# Patient Record
Sex: Female | Born: 1980 | Race: White | Hispanic: No | Marital: Married | Smoking: Never smoker
Health system: Southern US, Community
[De-identification: ages and names within clinical notes are randomized; demographics above are authoritative.]

## PROBLEM LIST (undated history)

## (undated) DIAGNOSIS — I10 Essential (primary) hypertension: Secondary | ICD-10-CM

## (undated) DIAGNOSIS — G35 Multiple sclerosis: Secondary | ICD-10-CM

## (undated) DIAGNOSIS — Z87442 Personal history of urinary calculi: Secondary | ICD-10-CM

## (undated) DIAGNOSIS — Z6791 Unspecified blood type, Rh negative: Secondary | ICD-10-CM

## (undated) DIAGNOSIS — O26899 Other specified pregnancy related conditions, unspecified trimester: Secondary | ICD-10-CM

## (undated) DIAGNOSIS — O364XX0 Maternal care for intrauterine death, not applicable or unspecified: Secondary | ICD-10-CM

## (undated) DIAGNOSIS — F419 Anxiety disorder, unspecified: Secondary | ICD-10-CM

## (undated) DIAGNOSIS — D649 Anemia, unspecified: Secondary | ICD-10-CM

## (undated) HISTORY — DX: Essential (primary) hypertension: I10

## (undated) HISTORY — DX: Maternal care for intrauterine death, not applicable or unspecified: O36.4XX0

## (undated) HISTORY — PX: INTRAUTERINE DEVICE (IUD) INSERTION: SHX5877

## (undated) HISTORY — DX: Unspecified blood type, rh negative: Z67.91

## (undated) HISTORY — DX: Multiple sclerosis: G35

## (undated) HISTORY — DX: Other specified pregnancy related conditions, unspecified trimester: O26.899

---

## 2002-02-25 HISTORY — PX: BREAST REDUCTION SURGERY: SHX8

## 2005-09-27 DIAGNOSIS — O364XX0 Maternal care for intrauterine death, not applicable or unspecified: Secondary | ICD-10-CM

## 2005-09-27 HISTORY — DX: Maternal care for intrauterine death, not applicable or unspecified: O36.4XX0

## 2005-10-01 ENCOUNTER — Inpatient Hospital Stay (HOSPITAL_COMMUNITY): Admission: AD | Admit: 2005-10-01 | Discharge: 2005-10-02 | Payer: Self-pay | Admitting: Gynecology

## 2005-10-01 ENCOUNTER — Ambulatory Visit (HOSPITAL_COMMUNITY): Admission: RE | Admit: 2005-10-01 | Discharge: 2005-10-02 | Payer: Self-pay | Admitting: Gynecology

## 2005-10-02 ENCOUNTER — Encounter (INDEPENDENT_AMBULATORY_CARE_PROVIDER_SITE_OTHER): Payer: Self-pay | Admitting: Specialist

## 2006-02-03 ENCOUNTER — Ambulatory Visit: Payer: Self-pay | Admitting: Gynecology

## 2006-02-06 ENCOUNTER — Inpatient Hospital Stay (HOSPITAL_COMMUNITY): Admission: AD | Admit: 2006-02-06 | Discharge: 2006-02-06 | Payer: Self-pay | Admitting: Gynecology

## 2006-02-17 ENCOUNTER — Ambulatory Visit: Payer: Self-pay | Admitting: Gynecology

## 2006-03-17 ENCOUNTER — Ambulatory Visit: Payer: Self-pay | Admitting: Gynecology

## 2006-04-10 ENCOUNTER — Ambulatory Visit: Payer: Self-pay | Admitting: Obstetrics & Gynecology

## 2006-05-06 ENCOUNTER — Ambulatory Visit (HOSPITAL_COMMUNITY): Admission: RE | Admit: 2006-05-06 | Discharge: 2006-05-06 | Payer: Self-pay | Admitting: Gynecology

## 2006-05-08 ENCOUNTER — Ambulatory Visit: Payer: Self-pay | Admitting: Obstetrics & Gynecology

## 2006-06-05 ENCOUNTER — Ambulatory Visit: Payer: Self-pay | Admitting: Obstetrics & Gynecology

## 2006-07-07 ENCOUNTER — Ambulatory Visit: Payer: Self-pay | Admitting: Gynecology

## 2006-07-14 ENCOUNTER — Ambulatory Visit: Payer: Self-pay | Admitting: Gynecology

## 2006-07-21 ENCOUNTER — Ambulatory Visit: Payer: Self-pay | Admitting: Gynecology

## 2006-08-04 ENCOUNTER — Ambulatory Visit: Payer: Self-pay | Admitting: Gynecology

## 2006-08-18 ENCOUNTER — Ambulatory Visit: Payer: Self-pay | Admitting: Gynecology

## 2006-08-25 ENCOUNTER — Ambulatory Visit: Payer: Self-pay | Admitting: Gynecology

## 2006-09-01 ENCOUNTER — Ambulatory Visit: Payer: Self-pay | Admitting: Gynecology

## 2006-09-08 ENCOUNTER — Ambulatory Visit: Payer: Self-pay | Admitting: Gynecology

## 2006-09-16 ENCOUNTER — Ambulatory Visit: Payer: Self-pay | Admitting: Family Medicine

## 2006-09-23 ENCOUNTER — Ambulatory Visit: Payer: Self-pay | Admitting: Family Medicine

## 2006-09-25 ENCOUNTER — Inpatient Hospital Stay (HOSPITAL_COMMUNITY): Admission: AD | Admit: 2006-09-25 | Discharge: 2006-09-25 | Payer: Self-pay | Admitting: Obstetrics and Gynecology

## 2006-09-25 ENCOUNTER — Ambulatory Visit: Payer: Self-pay | Admitting: Obstetrics and Gynecology

## 2006-09-30 ENCOUNTER — Ambulatory Visit: Payer: Self-pay | Admitting: Family Medicine

## 2006-10-05 ENCOUNTER — Ambulatory Visit: Payer: Self-pay | Admitting: Family Medicine

## 2006-10-05 ENCOUNTER — Inpatient Hospital Stay (HOSPITAL_COMMUNITY): Admission: AD | Admit: 2006-10-05 | Discharge: 2006-10-07 | Payer: Self-pay | Admitting: Family Medicine

## 2006-11-04 ENCOUNTER — Ambulatory Visit: Payer: Self-pay | Admitting: Family Medicine

## 2006-11-18 ENCOUNTER — Ambulatory Visit: Payer: Self-pay | Admitting: Family Medicine

## 2006-12-09 ENCOUNTER — Ambulatory Visit: Payer: Self-pay | Admitting: Family Medicine

## 2006-12-09 ENCOUNTER — Encounter: Payer: Self-pay | Admitting: Family Medicine

## 2008-05-16 ENCOUNTER — Encounter: Payer: Self-pay | Admitting: Obstetrics & Gynecology

## 2008-05-16 ENCOUNTER — Ambulatory Visit: Payer: Self-pay | Admitting: Obstetrics & Gynecology

## 2010-10-23 ENCOUNTER — Ambulatory Visit: Payer: Self-pay | Admitting: Obstetrics & Gynecology

## 2011-03-12 NOTE — Assessment & Plan Note (Signed)
NAMESHERRYL, VALIDO NO.:  [id number]   MEDICAL RECORD NO.:  [id number]          PATIENT TYPE:  POB   LOCATION:  [facility] at [facility]         FACILITY:  [facility]   PHYSICIAN:  Allie Bossier, MD        DATE OF BIRTH:  Jan 03, 1981   DATE OF SERVICE:  05/16/2008                                  CLINIC NOTE   Aivy is a 30 year old married white gravida 3, para 1-0-2-1, who has  a 39-month-old son.  She comes in for annual exam.  She has no  complaints.  She is happy with her Mirena.  She has worked on weight  loss since last year and has lost 30 pounds.  She has done this by not  eating as much.Marland Kitchen   PAST MEDICAL HISTORY:  Migraine (greatly reduced in frequency).   PAST SURGICAL HISTORY:  Bilateral breast reduction.   MEDICATIONS:  Mirena, multivitamin daily, and ibuprofen approximately 3  times a week.   No known drug allergies.   FAMILY HISTORY:  Significant for her maternal grandmother having breast  cancer, colon cancer, and lung cancer, she is deceased.  No latex  allergies.  The patient also had a 19-week demise that fetus had caught  her syndrome.   SOCIAL HISTORY:  Negative for tobacco, alcohol, or drug use.   PHYSICAL EXAMINATION:  VITAL SIGNS:  Weight 164 pounds, blood pressure  121/85, height is 5 feet 6 inches, and pulse is 89.  HEENT:  Normal.  BREAST:  Normal.  HEART:  Regular rate and rhythm.  LUNGS:  Clear to auscultation bilaterally.  ABDOMEN:  Benign.  PELVIS:  External genitalia, shaved no lesions.  Cervix parous, no  lesions.  Uterus, normal size and shape, slightly anteverted.  Adnexa  nontender, no masses.   ASSESSMENT/PLAN:  Annual exam.  Recommend self-breast and self-vulvar  exams monthly and she will continue her Mirena.      Allie Bossier, MD     MCD/MEDQ  D:  05/16/2008  T:  05/17/2008  Job:  [id number]

## 2011-03-15 NOTE — Assessment & Plan Note (Signed)
Robin Diaz, Robin Diaz             ACCOUNT NO.:  [id number]   MEDICAL RECORD NO.:  [id number]          PATIENT TYPE:  POB   LOCATION:  [facility]         FACILITY:  [facility]   PHYSICIAN:  Tinnie Gens, MD        DATE OF BIRTH:  1981-04-07   DATE OF SERVICE:  11/04/2006                                  CLINIC NOTE   CHIEF COMPLAINT:  IUD Insertion.   HISTORY OF PRESENT ILLNESS:  The patient is a 31 year old, gravida 3,  para 1-0-2-1, who is 4 weeks status post vaginal delivery of a female  infant, 9 pounds 4 ounces.  The patient is not currently breast feeding,  is not on birth control, but has not been sexually active.  The patient  reports no significant depression.  The baby is doing well and sleeps  through the night.  The patient desires IUD for contraception.  She  stopped bleeding approximately 2 weeks ago.   PHYSICAL EXAMINATION:  VITAL SIGNS: As noted on the chart.  GENERAL:  Well-developed, well-nourished female in no acute distress.  GU:  Female genitalia.  Vagina pink and rugae.  Uterus approximately 8-  week size, anteverted.  No adnexal mass or tenderness.   PROCEDURE:  Cervix was visualized, grasped anteriorly with single-tip  tenaculum.  The uterus was dilated to approximately 10 cm.  Jearld Adjutant IUD  was placed without difficulty.  Strings are trimmed to 1.5 cm.  The  patient tolerated the procedure well.   IMPRESSION:  1. Postpartum check, doing well.  2. Intrauterine device insertion.   PLAN:  1. Follow up in 2 weeks for string check.  2. Pap smear October 2008.           ______________________________  Tinnie Gens, MD     TP/MEDQ  D:  11/04/2006  T:  11/04/2006  Job:  [id number]

## 2011-03-15 NOTE — Assessment & Plan Note (Signed)
Robin Diaz, Robin Diaz             ACCOUNT NO.:  [id number]   MEDICAL RECORD NO.:  [id number]          PATIENT TYPE:  POB   LOCATION:  [facility]         FACILITY:  [facility]   PHYSICIAN:  Tinnie Gens, MD        DATE OF BIRTH:  02-02-1981   DATE OF SERVICE:  12/09/2006                                  CLINIC NOTE   CHIEF COMPLAINT:  Yearly examination.   HISTORY OF PRESENT ILLNESS:  The patient is a 30 year old gravida 3,  para 1-0-2-1, who is 8 weeks status post vaginal delivery of a female  infant, who comes in for an annual exam.  It has been since October,  2006 since her last Pap smear.  She has no other significant complaints  today.  She has an IUD which seems to be working fine for her except for  that she would like to lose some weight.   PAST MEDICAL HISTORY:  Significant for migraine headaches.   PAST SURGICAL HISTORY:  Bilateral breast reduction.   MEDICATIONS:  None.   ALLERGIES:  NONE KNOWN.   FAMILY HISTORY:  Her first pregnancy was a miscarriage at approximately  6 weeks.  Second pregnancy in December, 2006, she had a 19 week fetal  demise with Potter's syndrome.  Last pregnancy was a term vaginal  delivery of a 9 pound 4 ounce female infant.   GYN HISTORY:  No history of STD or abnormal Pap.   FOURTEEN POINT REVIEW OF SYSTEMS:  Reviewed.  Is negative for headache,  chest pain, shortness of breath, dysuria, constipation, abdominal pain,  nausea, vomiting, diarrhea, lower extremity swelling.   FAMILY HISTORY:  Significant for obesity.   SOCIAL HISTORY:  No tobacco, alcohol or drug use.  She works in [employer].   EXAM:  Her vitals are as noted in the chart.  Weight is 188 pounds.  She  is a well-developed, well-nourished female in no acute distress.  GU:  Normal external female genitalia.  BUS:  Normal.  VAGINA:  Pink and rugated.  CERVIX:  Parous, without lesion.  UTERUS:  Small, anteverted, no adnexal masses or tenderness.  IUD  strings are visible at  the cervical os.   IMPRESSION:  1. Yearly exam.  2. Obesity.   PLAN:  1. Pap smear today.  2. Discussed lifestyle changes, exercise, weight lifting and diet as a      means to control weight in the long term.  The patient will follow      up as needed for weight checks and encouragement, otherwise she      will be back in a year for yearly exam.           ______________________________  Tinnie Gens, MD     TP/MEDQ  D:  12/09/2006  T:  12/09/2006  Job:  [id number]

## 2011-03-15 NOTE — Discharge Summary (Signed)
Robin Diaz, Robin Diaz              ACCOUNT NO.:  [id number]   MEDICAL RECORD NO.:  [id number]          PATIENT TYPE:  INP   LOCATION:  9175                          FACILITY:  [facility]   PHYSICIAN:  Ginger Carne, MD  DATE OF BIRTH:  1981-05-24   DATE OF ADMISSION:  10/01/2005  DATE OF DISCHARGE:  10/02/2005                                 DISCHARGE SUMMARY   REASON FOR HOSPITALIZATION:  Nineteen weeks gestation.  Fetal death in  utero.   IN-HOSPITAL PROCEDURES:  Cytotec induction.   FINAL DIAGNOSIS:  1.  Nineteen weeks gestation.  2.  Fetal death in utero.  3.  Nonviable delivery of a female fetus weighing 200 grams.   HOSPITAL COURSE:  This is a 30 year old gravida 2, para 0 Caucasian female,  [redacted] weeks gestation by dates and ultrasound, who presented to the obstetrical  ultrasound unit at [facility] on October 01, 2005 for routine  anatomical scan.  At that time, a fetal death in utero was noted.  A  positive fetal heart rate was obtained in the office 1 day prior. The scan  demonstrated low fluid with a suggestion of Potter's syndrome. She is B  negative.   The patient underwent a Cytotec induction and delivered in the early morning  of October 02, 2005.  Placenta spontaneously delivered. It was complete and  intact. The patient's flow moderated during the course of the morning, and  she was sent home in the early afternoon of said date.   DISCHARGE INSTRUCTIONS:  She was discharged with routine instructions  including contacting the office for temperature elevation above 100.4  degrees Fahrenheit, perineal pain or increased bleeding.   DISCHARGE MEDICATIONS:  1.  She was prescribed Vicodin 1-2 every 4 to 6 hours as needed for pain, in      addition to utilizing ibuprofen.  2.  She received a RhoGAM shot prior to discharge.  3.  Xanax 0.5 milligrams also prescribed p.r.n. anxiety.   FOLLOW UP:  She will be rechecked in 4 weeks.      Ginger Carne, MD  Electronically Signed     SHB/MEDQ  D:  10/07/2005  T:  10/07/2005  Job:  [id number]

## 2011-03-18 ENCOUNTER — Ambulatory Visit (INDEPENDENT_AMBULATORY_CARE_PROVIDER_SITE_OTHER): Payer: PRIVATE HEALTH INSURANCE | Admitting: Obstetrics and Gynecology

## 2011-03-18 DIAGNOSIS — Z01419 Encounter for gynecological examination (general) (routine) without abnormal findings: Secondary | ICD-10-CM

## 2011-03-18 DIAGNOSIS — Z1272 Encounter for screening for malignant neoplasm of vagina: Secondary | ICD-10-CM

## 2011-03-18 DIAGNOSIS — Z30432 Encounter for removal of intrauterine contraceptive device: Secondary | ICD-10-CM

## 2011-04-15 ENCOUNTER — Ambulatory Visit: Payer: PRIVATE HEALTH INSURANCE | Admitting: Obstetrics and Gynecology

## 2011-05-09 NOTE — Assessment & Plan Note (Signed)
Robin Diaz, OBI NO.:  [id number]  MEDICAL RECORD NO.:  [id number]           PATIENT TYPE:  LOCATION:  [facility] at [facility]           FACILITY:  PHYSICIAN:  Argentina Donovan, MD        DATE OF BIRTH:  January 17, 1981  DATE OF SERVICE:  03/18/2011                                 CLINIC NOTE  HISTORY OF PRESENT ILLNESS:  The patient is a 30 year old gravida 3, para 1-0-2-1 whose last child is almost 13 years old is in for removal an IUD in order to conceive.  She has gained about 35 pounds in the last year since she has had an IUD.  The thyroid is symmetrical.  No dominant masses.  Breasts are symmetrical.  No nipple discharge.  No supraclavicular, axillary nodes.  No dominant masses.  There are surgical scars and was slightly firm at the area of the scars from previous reduction mammoplasty.  PHYSICAL EXAMINATION:  ABDOMEN:  Soft, flat, nontender.  No masses or organomegaly. SKIN:  She does have a subcutaneous firm nodule attached to the skin probably sebaceous cyst just above the suprapubic area. GENITALIA:  External is normal.  BUS within normal limits.  The vagina is clean and well rugated.  The cervix is clean, parous.  Pap smear was taken.  IUD was removed without incident.  The uterus anterior normal size, shape, consistency.  Adnexa could not be outlined.  IMPRESSION:  Normal gynecological examination, removal of IUD.  Patient encouraged to start oral prenatal vitamins with folic acid. Prescription given for the same.          ______________________________ Argentina Donovan, MD    PR/MEDQ  D:  03/18/2011  T:  03/19/2011  Job:  [id number]

## 2011-08-01 ENCOUNTER — Ambulatory Visit: Payer: Self-pay

## 2011-12-10 ENCOUNTER — Observation Stay: Payer: Self-pay

## 2011-12-18 ENCOUNTER — Observation Stay: Payer: Self-pay

## 2011-12-18 LAB — PIH PROFILE
BUN: 6 mg/dL — ABNORMAL LOW (ref 7–18)
Chloride: 107 mmol/L (ref 98–107)
Co2: 24 mmol/L (ref 21–32)
Creatinine: 0.63 mg/dL (ref 0.60–1.30)
HGB: 9.8 g/dL — ABNORMAL LOW (ref 12.0–16.0)
MCH: 28.1 pg (ref 26.0–34.0)
MCHC: 33.7 g/dL (ref 32.0–36.0)
MCV: 83 fL (ref 80–100)
Potassium: 3.4 mmol/L — ABNORMAL LOW (ref 3.5–5.1)
RBC: 3.47 10*6/uL — ABNORMAL LOW (ref 3.80–5.20)
Sodium: 142 mmol/L (ref 136–145)

## 2011-12-18 LAB — PROTEIN / CREATININE RATIO, URINE: Protein/Creat. Ratio: 146 mg/gCREAT (ref 0–200)

## 2011-12-31 ENCOUNTER — Inpatient Hospital Stay: Payer: Self-pay

## 2011-12-31 LAB — PIH PROFILE
Co2: 19 mmol/L — ABNORMAL LOW (ref 21–32)
EGFR (African American): >60
EGFR (Non-African Amer.): >60
Glucose: 116 mg/dL — ABNORMAL HIGH (ref 65–99)
HGB: 10.1 g/dL — ABNORMAL LOW (ref 12.0–16.0)
MCHC: 33.7 g/dL (ref 32.0–36.0)
RDW: 14.6 % — ABNORMAL HIGH (ref 11.5–14.5)
SGOT(AST): 7 U/L — ABNORMAL LOW (ref 15–37)
Uric Acid: 3.4 mg/dL (ref 2.6–6.0)
WBC: 13.6 10*3/uL — ABNORMAL HIGH (ref 3.6–11.0)

## 2012-01-01 LAB — PROTEIN / CREATININE RATIO, URINE
Creatinine, Urine: 94.5 mg/dL (ref 30.0–125.0)
Protein/Creat. Ratio: 116 mg/gCREAT (ref 0–200)

## 2012-01-02 LAB — HEMATOCRIT: HCT: 27.3 % — ABNORMAL LOW (ref 35.0–47.0)

## 2012-01-06 LAB — PATHOLOGY REPORT

## 2012-05-29 ENCOUNTER — Encounter: Payer: Self-pay | Admitting: Obstetrics and Gynecology

## 2012-10-05 ENCOUNTER — Emergency Department: Payer: Self-pay | Admitting: Emergency Medicine

## 2012-10-05 LAB — BASIC METABOLIC PANEL
Anion Gap: 6 — ABNORMAL LOW (ref 7–16)
BUN: 10 mg/dL (ref 7–18)
Creatinine: 0.66 mg/dL (ref 0.60–1.30)
EGFR (African American): >60
EGFR (Non-African Amer.): >60
Glucose: 99 mg/dL (ref 65–99)
Osmolality: 278 (ref 275–301)

## 2012-10-05 LAB — URINALYSIS, COMPLETE
Bacteria: NONE SEEN
Leukocyte Esterase: NEGATIVE
Nitrite: NEGATIVE
RBC,UR: 8751 /HPF (ref 0–5)
Specific Gravity: 1.014 (ref 1.003–1.030)
WBC UR: 12 /HPF (ref 0–5)

## 2012-10-05 LAB — CBC WITH DIFFERENTIAL/PLATELET
Basophil %: 0.6 %
Eosinophil %: 2.7 %
HCT: 36.3 % (ref 35.0–47.0)
HGB: 12 g/dL (ref 12.0–16.0)
Lymphocyte %: 30.6 %
MCH: 26.7 pg (ref 26.0–34.0)
MCHC: 33.2 g/dL (ref 32.0–36.0)
Neutrophil #: 5.7 10*3/uL (ref 1.4–6.5)
RBC: 4.5 10*6/uL (ref 3.80–5.20)

## 2012-10-05 LAB — PREGNANCY, URINE: Pregnancy Test, Urine: NEGATIVE m[IU]/mL

## 2013-03-31 ENCOUNTER — Emergency Department: Payer: Self-pay | Admitting: Emergency Medicine

## 2013-03-31 LAB — URINALYSIS, COMPLETE
Bacteria: NONE SEEN
Glucose,UR: NEGATIVE mg/dL (ref 0–75)
Nitrite: NEGATIVE
Ph: 6 (ref 4.5–8.0)
Squamous Epithelial: 3

## 2013-03-31 LAB — COMPREHENSIVE METABOLIC PANEL
BUN: 12 mg/dL (ref 7–18)
Calcium, Total: 9.4 mg/dL (ref 8.5–10.1)
Co2: 27 mmol/L (ref 21–32)
EGFR (African American): >60
Glucose: 107 mg/dL — ABNORMAL HIGH (ref 65–99)
Osmolality: 276 (ref 275–301)
SGOT(AST): 17 U/L (ref 15–37)
SGPT (ALT): 31 U/L (ref 12–78)

## 2013-03-31 LAB — CBC
HCT: 38 % (ref 35.0–47.0)
HGB: 12.8 g/dL (ref 12.0–16.0)
MCH: 27.5 pg (ref 26.0–34.0)
Platelet: 297 10*3/uL (ref 150–440)

## 2013-10-28 DIAGNOSIS — G35 Multiple sclerosis: Secondary | ICD-10-CM

## 2013-10-28 HISTORY — DX: Multiple sclerosis: G35

## 2013-11-23 ENCOUNTER — Emergency Department: Payer: Self-pay | Admitting: Emergency Medicine

## 2013-11-23 LAB — COMPREHENSIVE METABOLIC PANEL
ALK PHOS: 67 U/L
ANION GAP: 4 — AB (ref 7–16)
AST: 23 U/L (ref 15–37)
Albumin: 3.9 g/dL (ref 3.4–5.0)
BILIRUBIN TOTAL: 0.3 mg/dL (ref 0.2–1.0)
BUN: 8 mg/dL (ref 7–18)
CHLORIDE: 106 mmol/L (ref 98–107)
CREATININE: 0.67 mg/dL (ref 0.60–1.30)
Calcium, Total: 9.4 mg/dL (ref 8.5–10.1)
Co2: 27 mmol/L (ref 21–32)
EGFR (Non-African Amer.): >60
GLUCOSE: 100 mg/dL — AB (ref 65–99)
OSMOLALITY: 272 (ref 275–301)
Potassium: 3.9 mmol/L (ref 3.5–5.1)
SGPT (ALT): 26 U/L (ref 12–78)
Sodium: 137 mmol/L (ref 136–145)
TOTAL PROTEIN: 7.7 g/dL (ref 6.4–8.2)

## 2013-11-23 LAB — CBC
HCT: 40.5 % (ref 35.0–47.0)
HGB: 13.5 g/dL (ref 12.0–16.0)
MCH: 28 pg (ref 26.0–34.0)
MCHC: 33.3 g/dL (ref 32.0–36.0)
MCV: 84 fL (ref 80–100)
Platelet: 279 10*3/uL (ref 150–440)
RBC: 4.81 10*6/uL (ref 3.80–5.20)
RDW: 13.1 % (ref 11.5–14.5)
WBC: 9.6 10*3/uL (ref 3.6–11.0)

## 2013-11-23 LAB — URINALYSIS, COMPLETE
Glucose,UR: NEGATIVE mg/dL (ref 0–75)
Ketone: NEGATIVE
LEUKOCYTE ESTERASE: NEGATIVE
NITRITE: NEGATIVE
PROTEIN: NEGATIVE
Ph: 7 (ref 4.5–8.0)
RBC,UR: 1 /HPF (ref 0–5)
Specific Gravity: 1.006 (ref 1.003–1.030)
Squamous Epithelial: 5
WBC UR: 2 /HPF (ref 0–5)

## 2013-11-26 ENCOUNTER — Ambulatory Visit: Payer: Self-pay | Admitting: Neurology

## 2013-11-26 LAB — HCG, QUANTITATIVE, PREGNANCY: Beta Hcg, Quant.: <1 m[IU]/mL — ABNORMAL LOW

## 2013-12-01 ENCOUNTER — Ambulatory Visit: Payer: Self-pay | Admitting: Neurology

## 2013-12-01 LAB — CSF CELL COUNT WITH DIFFERENTIAL
CSF Tube #: 1
CSF Tube #: 4
EOS PCT: 0 %
EOS PCT: 0 %
LYMPHS PCT: 0 %
Lymphocytes: 0 %
MONOCYTES/MACROPHAGES: 0 %
MONOCYTES/MACROPHAGES: 0 %
NEUTROS PCT: 0 %
NEUTROS PCT: 0 %
OTHER CELLS: 0 %
Other Cells: 0 %
RBC (CSF): 106 /mm3
RBC (CSF): 62 /mm3
WBC (CSF): 0 /mm3
WBC (CSF): 0 /mm3

## 2013-12-01 LAB — PROTEIN, CSF: Protein, CSF: 39 mg/dL (ref 15–45)

## 2013-12-01 LAB — GLUCOSE, CSF: GLUCOSE, CSF: 96 mg/dL — AB (ref 40–75)

## 2013-12-02 ENCOUNTER — Ambulatory Visit: Payer: Self-pay | Admitting: Neurology

## 2013-12-04 LAB — CSF CULTURE

## 2014-05-26 ENCOUNTER — Encounter: Payer: Self-pay | Admitting: Neurology

## 2014-05-28 ENCOUNTER — Encounter: Payer: Self-pay | Admitting: Neurology

## 2014-06-28 ENCOUNTER — Encounter: Payer: Self-pay | Admitting: Neurology

## 2014-07-28 ENCOUNTER — Encounter: Payer: Self-pay | Admitting: Neurology

## 2014-08-05 IMAGING — CT CT HEAD WITHOUT CONTRAST
1 series · 16 of 30 positions shown, 20 images · non-contrast
Comparison: None.

CLINICAL DATA: Blurred vision, headache

EXAM:
CT HEAD WITHOUT CONTRAST
TECHNIQUE: Contiguous axial images were obtained from the base of the skull
through the vertex without intravenous contrast.

[Series 2: head wo · axial · 0.40mm/px · z∈[-153,-27]mm · 16 of 32 slices shown, 20 images]
[im 2/32  brain]
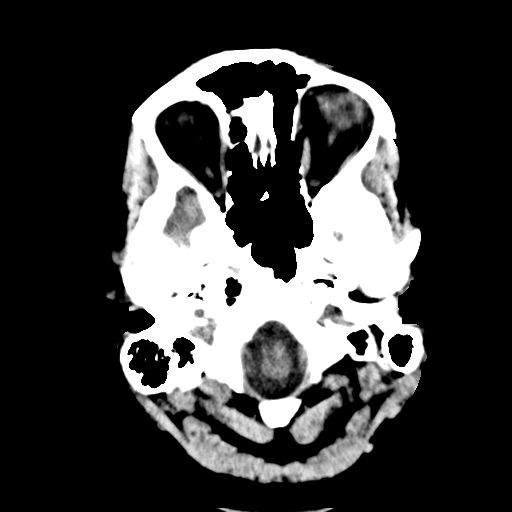
[im 2/32  bone]
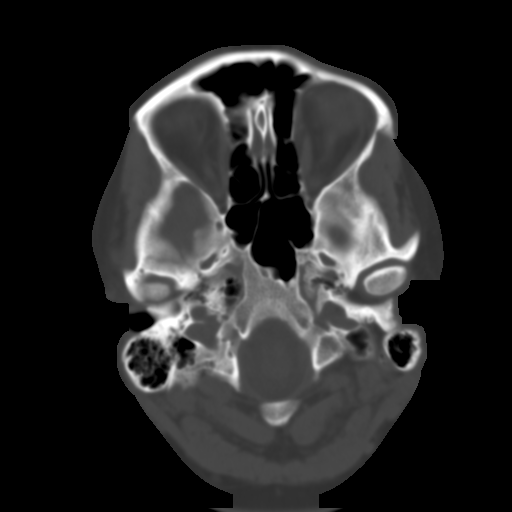
[im 4/32  brain]
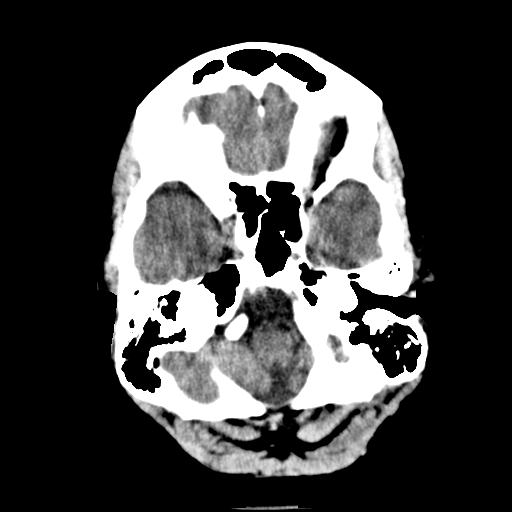
[im 6/32  brain]
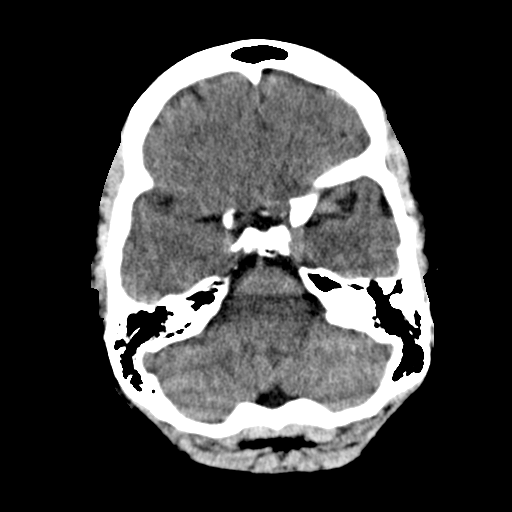
[im 8/32  brain]
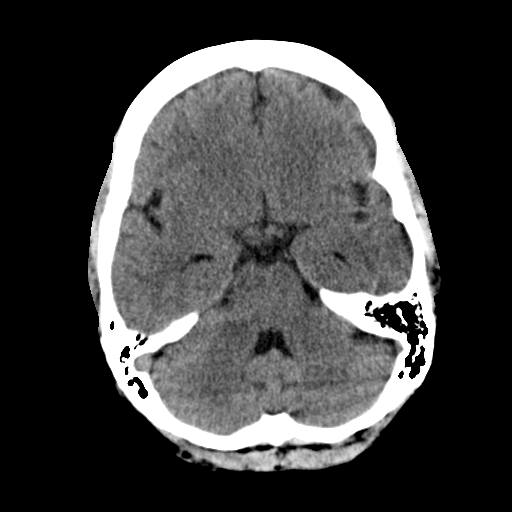
[im 9/32  brain]
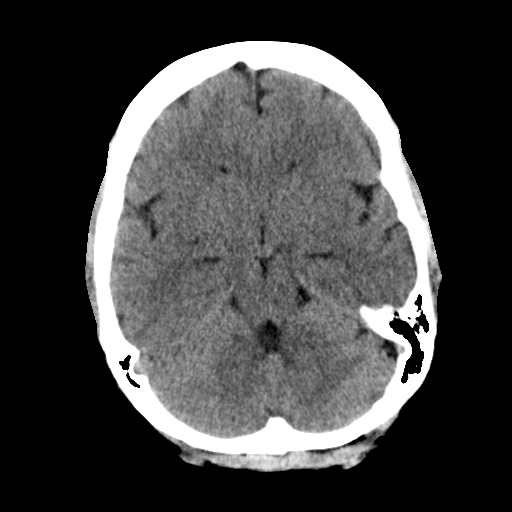
[im 9/32  bone]
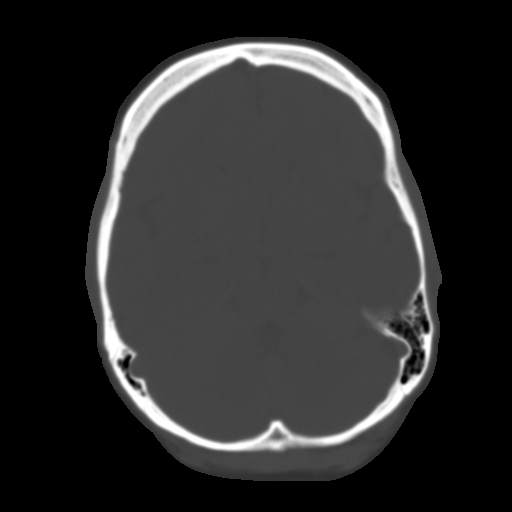
[im 11/32  brain]
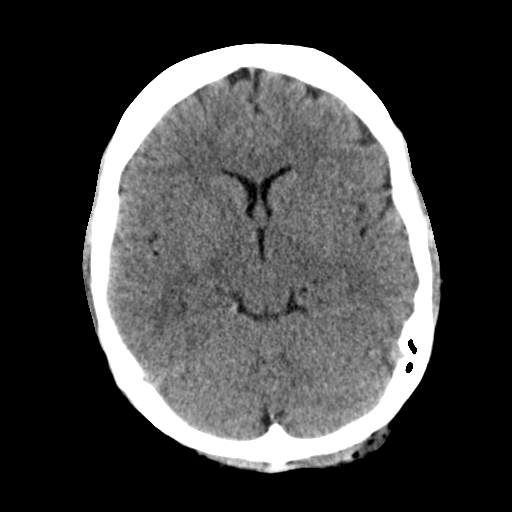
[im 13/32  brain]
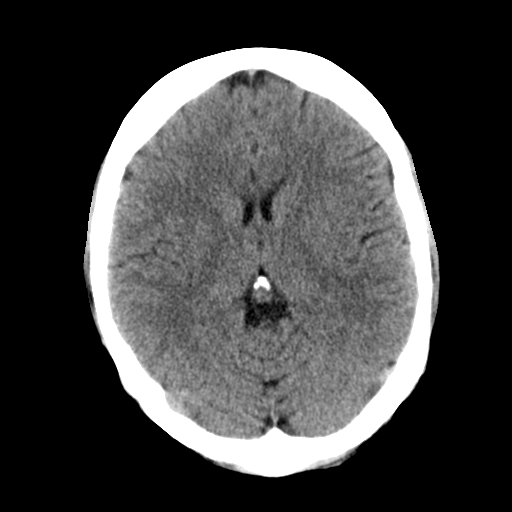
[im 15/32  brain]
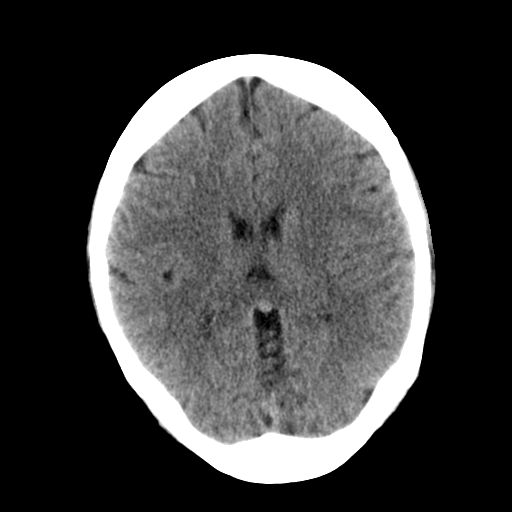
[im 17/32  brain]
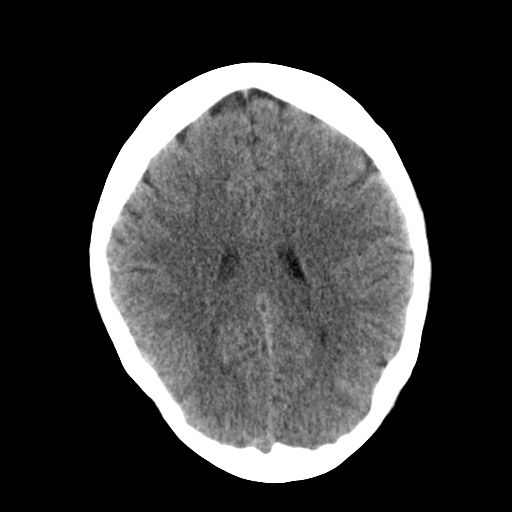
[im 17/32  bone]
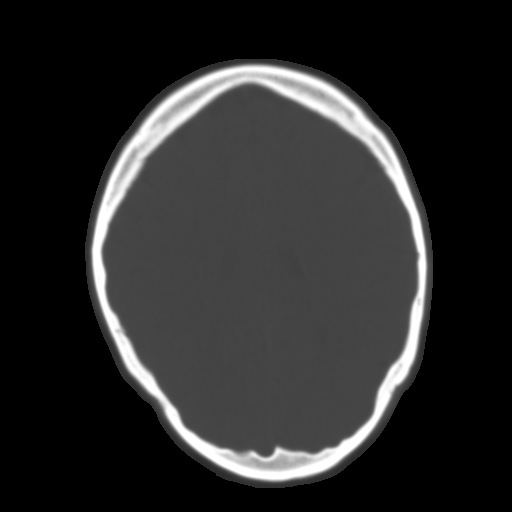
[im 19/32  brain]
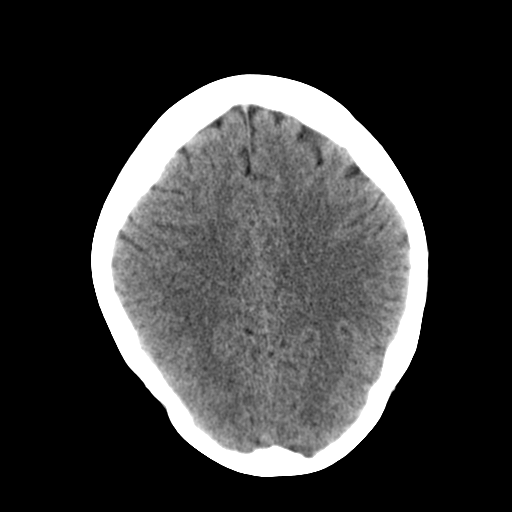
[im 21/32  brain]
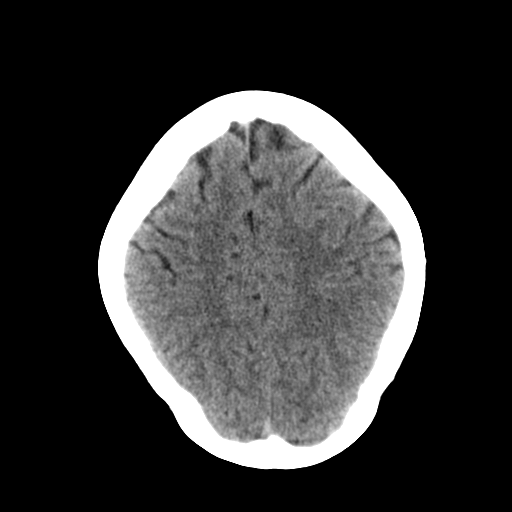
[im 23/32  brain]
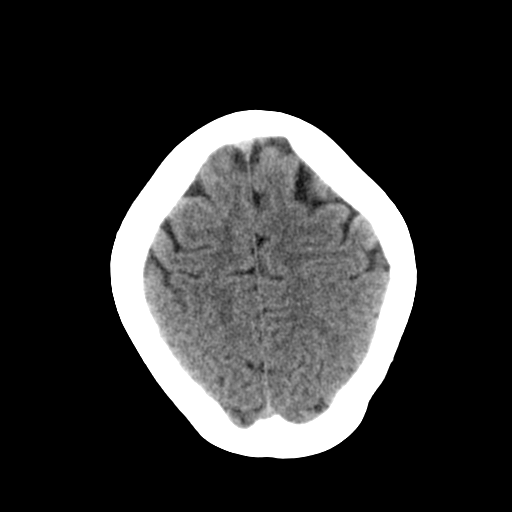
[im 24/32  brain]
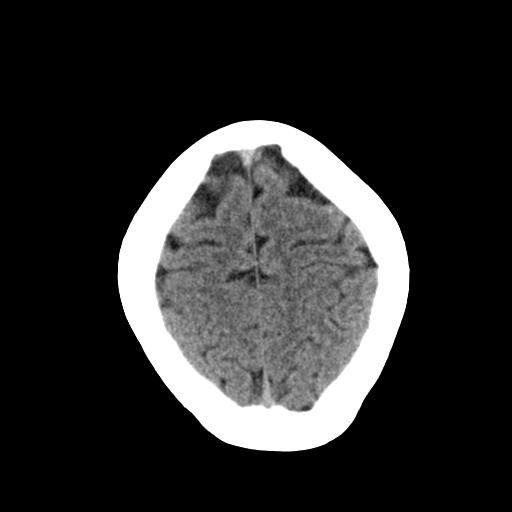
[im 24/32  bone]
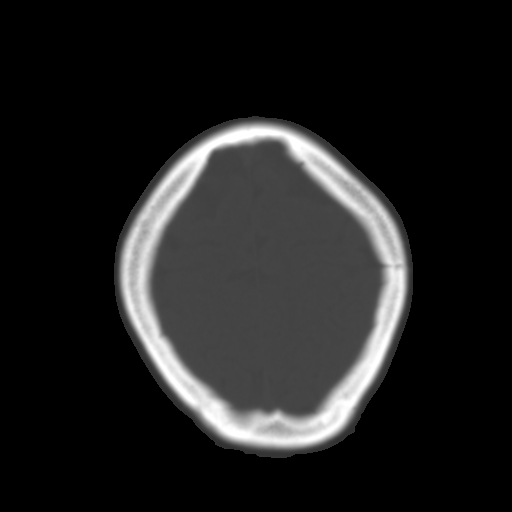
[im 26/32  brain]
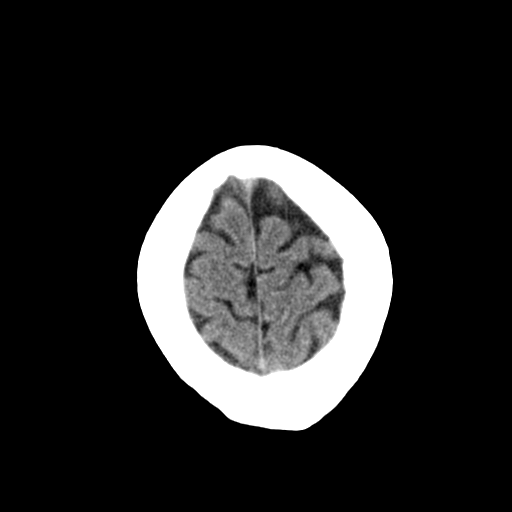
[im 28/32  brain]
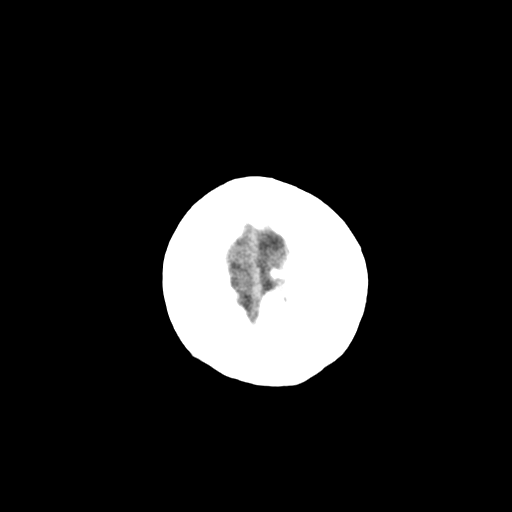
[im 30/32  brain]
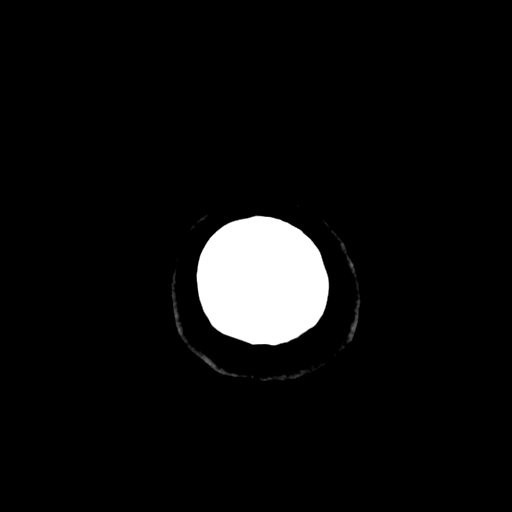

[16 of 30 positions shown; findings below may reference images not displayed]

FINDINGS: Mild atrophy. There is no evidence of acute intracranial hemorrhage,
brain edema, mass lesion, acute infarction, mass effect, or midline
shift. Acute infarct may be inapparent on noncontrast CT. No other
intra-axial abnormalities are seen, and the ventricles and sulci are
within normal limits in size and symmetry. No abnormal extra-axial
fluid collections or masses are identified. No significant calvarial
abnormality.
IMPRESSION: Negative for bleed or other acute intracranial process.

## 2014-08-13 IMAGING — RF LUMBAR PUNCTURE FLUORO GUIDE
1 series · 2 of 2 positions shown · non-contrast
Comparison: none

CLINICAL DATA: Unexplained visual loss and headache

[Series 1: fluoro_iodine 2fps_bw · 0.18mm/px · 2 of 2 frames shown]
[frame 1/2]
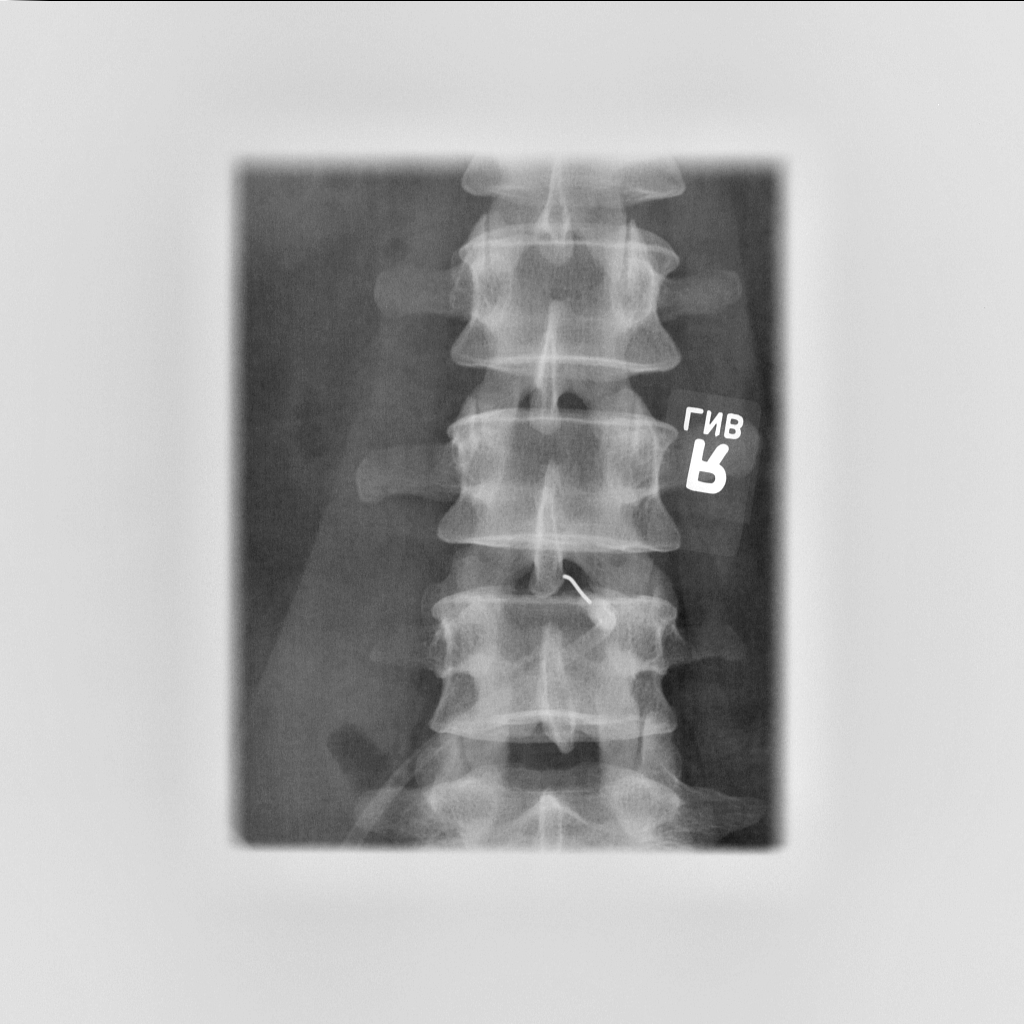
[frame 2/2]
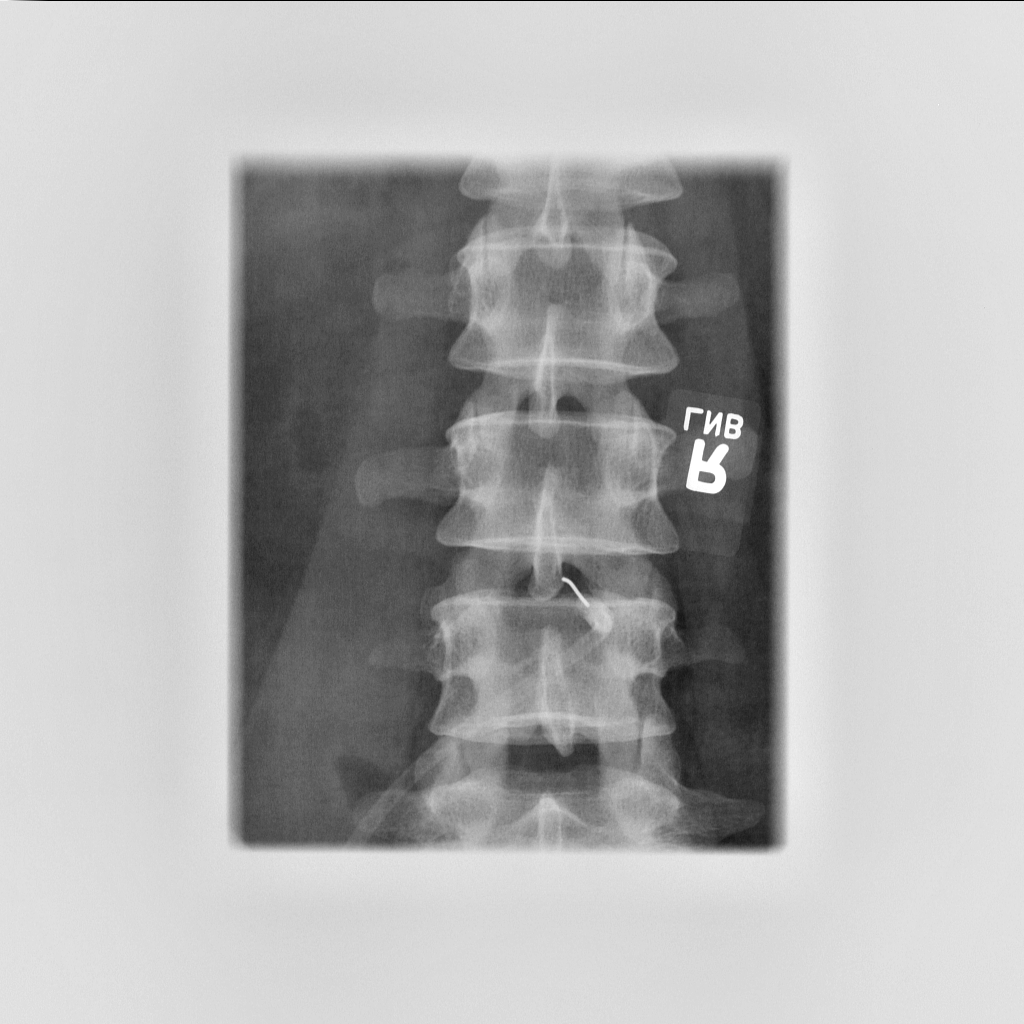

[2 of 2 positions shown; findings below may reference images not displayed]

EXAM:
DIAGNOSTIC LUMBAR PUNCTURE UNDER FLUOROSCOPIC GUIDANCE

FLUOROSCOPY TIME:  0 min, 36 seconds

PROCEDURE:
Informed consent was obtained from the patient prior to the
procedure, including potential complications of headache, allergy,
and pain. With the patient prone, the lower back was prepped with
Betadine. 1% Lidocaine was used for local anesthesia. Lumbar
puncture was performed at the L3-4 level using a 22 gauge needle
with return of clear CSF. An opening pressure was not obtained due
to the slow release of the CSF which required aspiration. FourMl of
CSF were obtained for laboratory studies. The patient tolerated the
procedure well and there were no apparent complications.
IMPRESSION: The patient underwent successful lumbar puncture for CSF fluid
acquisition.

## 2014-08-28 ENCOUNTER — Encounter: Payer: Self-pay | Admitting: Neurology

## 2015-03-07 NOTE — H&P (Signed)
L&D Evaluation:  History:   HPI 34 year old G3 P1011 with EDC=12/29/2011 by LMP=03/20/2012 presents at 73 3/7 weeks from her work with c/o not feeling well all day with mild headache, sweating, tightness in her chest, spots in her vision and an elevated BP at work of 180/90. 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    Presents with other, headache, visual changes, elevated BP    Patient's Medical History Kidney stones    Patient's Surgical History Breast reduction 2003    Medications Pre Natal Vitamins    Allergies NKDA    Social History none    Family History Non-Contributory   ROS:   ROS see HPI   Exam:   Vital Signs BPs 139/89, 140/68, 134/67, 132/66, 139/76, 144/75, 142/73, 146/75, and 134/67    Urine Protein trace    General appears anxious    Mental Status clear    Chest clear    Heart normal sinus rhythm, no murmur/gallop/rubs    Abdomen gravid, non-tender    Estimated Fetal Weight Average for gestational age    Fetal Position vtx    Edema no edema    Reflexes 1+    Mebranes Intact    FHT 125-130 with accels to 150s, moderate variability    Ucx absent    Skin dry    Other PIH labs: H&H: 9.8/28.9, plt=212K, uric acid=3.7, SGOT=11, BUN/cr=6/.63 and pro/cr=146   Impression:   Impression IUP at 38 3/7 weeks with no evidence of Preeclampsia. ?PIH   Plan:   Plan Discharge home. Start maternity leave. FU at [facility] IN 2 days for  BP check/ROB.   Electronic Signatures: Trinna Balloon (CNM)  (Signed 21-Feb-13 07:23)  Authored: L&D Evaluation   Last Updated: 21-Feb-13 07:23 by Trinna Balloon (CNM)

## 2017-02-20 ENCOUNTER — Ambulatory Visit (INDEPENDENT_AMBULATORY_CARE_PROVIDER_SITE_OTHER): Payer: Medicaid Other | Admitting: Certified Nurse Midwife

## 2017-02-20 ENCOUNTER — Encounter: Payer: Self-pay | Admitting: Certified Nurse Midwife

## 2017-02-20 VITALS — BP 130/90 | HR 76 | Ht 67.0 in | Wt 224.0 lb

## 2017-02-20 DIAGNOSIS — Z124 Encounter for screening for malignant neoplasm of cervix: Secondary | ICD-10-CM | POA: Diagnosis not present

## 2017-02-20 DIAGNOSIS — N839 Noninflammatory disorder of ovary, fallopian tube and broad ligament, unspecified: Secondary | ICD-10-CM

## 2017-02-20 DIAGNOSIS — N838 Other noninflammatory disorders of ovary, fallopian tube and broad ligament: Secondary | ICD-10-CM

## 2017-02-20 DIAGNOSIS — Z0000 Encounter for general adult medical examination without abnormal findings: Secondary | ICD-10-CM | POA: Diagnosis not present

## 2017-02-20 DIAGNOSIS — R61 Generalized hyperhidrosis: Secondary | ICD-10-CM

## 2017-02-20 DIAGNOSIS — R1032 Left lower quadrant pain: Secondary | ICD-10-CM | POA: Diagnosis not present

## 2017-02-20 DIAGNOSIS — Z01419 Encounter for gynecological examination (general) (routine) without abnormal findings: Secondary | ICD-10-CM | POA: Diagnosis not present

## 2017-02-20 NOTE — Progress Notes (Signed)
Gynecology Annual Exam  PCP: [facility]/ Eddie Candle. Midwife at [place].  Chief Complaint:  Chief Complaint  Patient presents with  . Gynecologic Exam    pt c/o back pain and LLQ pain and blood in urine    History of Present Illness: Robin Diaz presents today for her annual exam. She is a 36 year old Caucasian/White female, G3 P2012, whose LMP was ?9 April. Her menses are monthly but consist of one day of spotting on the Mirena IUD (inserted May 2013). She denies dysmenorrhea.  She is wondering if she is menopausal because she is having night sweats and and insomnia. Her mother had early menopause in her early 16's  She has the following complaints: LLQ pain. She had reported LLQ pain at the time of her last annual 01/26/2016 and a ultrasound at that time showed a complex 2.5 cm right ovarian cyst. She did not return for a repeat pelvic ultrasound. She has a history of kidney stones and has also had gross hematuria on 2 occasions this month. The first time she had hematuria was 1 April and this was accompanied by ack pain on the left. Four days ago the hematuria returned and was accompanied by LLQ pain. No hematuria or LLQ pain today.  The patient's past medical history is also remarkable for hypertension and multiple sclerosis. She started Losartan in December and Gilemia for her MS  in October. She had an MRI recently that showed a new lesion.   Her most recent pap smear was obtained 01/26/2016 and was negative/ neg HRHPV.  Mammogram is not applicable. There is a positive  family history of breast cancer in a paternal cousin (age 22)with recurrence and a paternal grandmother. Genetic testing has not been done. There is no family history of ovarian cancer. The patient does do occ monthly self breast exams. Her maternal uncle just died from colon cancer at age 68 and her paternal grandmother had colon cancer in her 74s. The patient does not smoke.  The patient  does not drink alcohol..  The patient exercises regularly using a recumbent bicycle 3-4 times a day. The patient does get adequate calcium in her diet. The patient does  take take a vitamin D3 supplement She has not had a recent cholesterol screen 09/2016 which was normal    Review of Systems: Review of Systems  Constitutional: Negative for chills, fever and weight loss.       Positive for weight gain of 6#  HENT: Negative for congestion, sinus pain and sore throat.   Eyes: Positive for blurred vision. Negative for pain.  Respiratory: Negative for hemoptysis, shortness of breath and wheezing.   Cardiovascular: Negative for chest pain, palpitations and leg swelling.  Gastrointestinal: Positive for abdominal pain. Negative for blood in stool, diarrhea, heartburn, nausea and vomiting.  Genitourinary: Positive for frequency and hematuria. Negative for dysuria and urgency.  Musculoskeletal: Positive for joint pain (and swelling). Negative for back pain and myalgias.  Skin: Negative for itching and rash.  Neurological: Positive for weakness. Negative for dizziness, tingling and headaches.  Endo/Heme/Allergies: Negative for environmental allergies and polydipsia. Does not bruise/bleed easily.       Negative for hirsutism   Psychiatric/Behavioral: Negative for depression. The patient is nervous/anxious and has insomnia.     Past Medical History:  Past Medical History:  Diagnosis Date  . Hypertension   . 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 Multiple sclerosis (HCC) 2015  . Rh negative, maternal     Past Surgical History:  Past Surgical History:  Procedure Laterality Date  . BREAST REDUCTION SURGERY  02/2002  . INTRAUTERINE DEVICE (IUD) INSERTION      Family History:  Family History  Problem Relation Age of Onset  . Hypertension Father   . Colon cancer Maternal Uncle 59  . Breast cancer Paternal Grandmother 24     second  . Colon cancer Paternal Grandmother 110    first  . Lung cancer Paternal Grandmother     third  . Breast cancer Cousin 81    paternal cousin    Social History:  Social History   Social History  . Marital status: Married    Spouse name: N/A  . Number of children: 2  . Years of education: N/A   Occupational History  . Disabled    Social History Main Topics  . Smoking status: Never Smoker  . Smokeless tobacco: Never Used  . Alcohol use No  . Drug use: No     Comment: Former MJ use  . Sexual activity: Yes    Birth control/ protection: IUD   Other Topics Concern  . Not on file   Social History Narrative  . No narrative on file    Allergies:  Allergies  Allergen Reactions  . Gadobenate Swelling    Lower lip swelling.    Medications: Prior to Admission medications   Medication Sig Start Date End Date Taking? Authorizing Provider  acyclovir (ZOVIRAX) 400 MG tablet Take by mouth. 12/01/15  Yes Historical Provider, MD  Cholecalciferol (VITAMIN D-1000 MAX ST) 1000 units tablet Take by mouth.   Yes Historical Provider, MD  Fingolimod HCl (GILENYA) 0.5 MG CAPS Take by mouth. 12/12/16 12/12/17 Yes Historical Provider, MD  hydrocortisone 2.5 % cream Apply topically. 09/11/16 09/11/17 Yes Historical Provider, MD  ibuprofen (ADVIL,MOTRIN) 200 MG tablet Take by mouth.   Yes Historical Provider, MD  levonorgestrel (MIRENA) 20 MCG/24HR IUD 1 each by Intrauterine route once.   Yes Historical Provider, MD  losartan (COZAAR) 25 MG tablet Take by mouth. 10/18/16 10/18/17 Yes Historical Provider, MD  vitamin B-12 (CYANOCOBALAMIN) 1000 MCG tablet Take by mouth.   Yes Historical Provider, MD    Physical Exam Vitals: BP 130/90   Pulse 76   Ht 5\' 7"  (1.702 m)   Wt 101.6 kg (224 lb)   LMP  (Exact Date)   BMI 35.08 kg/m   General: WF in NAD HEENT: normocephalic, anicteric Thyroid: no enlargement, no palpable nodules Pulmonary: No increased work of breathing,  CTAB Cardiovascular: RRR  Without murmur Breast: Breast symmetrical, no tenderness, no palpable nodules or masses, no skin or nipple retraction present, no nipple discharge.  No axillary, infraclavicular or supraclavicular lymphadenopathy. Abdomen: soft, non-tender, non-distended.  Umbilicus without lesions.  No hepatomegaly or masses palpable. No evidence of hernia  Genitourinary:  External: Normal external female genitalia.  Normal urethral meatus, normal Bartholin's and Skene's glands.    Vagina: Normal vaginal mucosa, no evidence of prolapse.    Cervix: Grossly normal in appearance, no bleeding, IUD strings present  Uterus: AV, NSSC, mobile, NT  Adnexa:  no adnexal masses, NT  Rectal: deferred  Lymphatic: no evidence of inguinal lymphadenopathy Extremities: no edema, erythema, or tenderness Neurologic: Grossly intact Psychiatric: mood appropriate, affect full    Assessment: 36 y.o. Z6X0960 well woman exam Histroy of right ovarian complex cyst LLQ pain, may have been  due to kidney stone since accompanied by hematuria  UA dipstick negative Night sweats and minimal bleeding on Mirena R/O perimenopause  Plan:  1. Encounter for gynecological examination  - PAP IG, CT-NG, RFX HPV ALL -normal pelvic exam  2. Left lower quadrant pain-possibly due to kidney stones, but will rule out gyn cause - US Transvaginal Non-OB; Future  3. Night sweats - FSH/LH  4. Screening for cervical cancer - PAP IG, CT-NG, RFX HPV ALL  5. Ovarian mass, right - US Transvaginal Non-OB; Future  5) Routine healthcare maintenance including cholesterol, diabetes screening managed by PCP   6) Discussed MYRIK testing for family history of breast cancer-she currently has Medicaid, will check on insurance coverage  7) Follow up with me after ultrasound Farrel Conners, CNM

## 2017-02-21 LAB — FSH/LH
FSH: 43 m[IU]/mL
LH: 37.3 m[IU]/mL

## 2017-02-23 LAB — PAP IG, CT-NG, RFX HPV ALL
CHLAMYDIA, NUC. ACID AMP: NEGATIVE
GONOCOCCUS BY NUCLEIC ACID AMP: NEGATIVE
PAP Smear Comment: 0

## 2017-02-24 ENCOUNTER — Encounter: Payer: Self-pay | Admitting: Certified Nurse Midwife

## 2017-02-24 DIAGNOSIS — G35 Multiple sclerosis: Secondary | ICD-10-CM | POA: Insufficient documentation

## 2017-02-24 DIAGNOSIS — H469 Unspecified optic neuritis: Secondary | ICD-10-CM

## 2017-02-24 DIAGNOSIS — N200 Calculus of kidney: Secondary | ICD-10-CM | POA: Insufficient documentation

## 2017-02-27 ENCOUNTER — Ambulatory Visit (INDEPENDENT_AMBULATORY_CARE_PROVIDER_SITE_OTHER): Payer: Medicaid Other

## 2017-02-27 ENCOUNTER — Ambulatory Visit (INDEPENDENT_AMBULATORY_CARE_PROVIDER_SITE_OTHER): Payer: Medicaid Other | Admitting: Certified Nurse Midwife

## 2017-02-27 ENCOUNTER — Encounter: Payer: Self-pay | Admitting: Certified Nurse Midwife

## 2017-02-27 VITALS — BP 128/86 | HR 72 | Ht 67.0 in | Wt 223.0 lb

## 2017-02-27 DIAGNOSIS — N839 Noninflammatory disorder of ovary, fallopian tube and broad ligament, unspecified: Secondary | ICD-10-CM

## 2017-02-27 DIAGNOSIS — R1032 Left lower quadrant pain: Secondary | ICD-10-CM

## 2017-02-27 DIAGNOSIS — N951 Menopausal and female climacteric states: Secondary | ICD-10-CM

## 2017-02-27 DIAGNOSIS — N838 Other noninflammatory disorders of ovary, fallopian tube and broad ligament: Secondary | ICD-10-CM

## 2017-03-13 ENCOUNTER — Ambulatory Visit: Payer: Medicaid Other | Admitting: Certified Nurse Midwife

## 2017-03-13 ENCOUNTER — Encounter: Payer: Self-pay | Admitting: Certified Nurse Midwife

## 2017-03-13 ENCOUNTER — Other Ambulatory Visit: Payer: Medicaid Other

## 2017-03-13 ENCOUNTER — Ambulatory Visit (INDEPENDENT_AMBULATORY_CARE_PROVIDER_SITE_OTHER): Payer: Medicaid Other | Admitting: Certified Nurse Midwife

## 2017-03-13 VITALS — BP 112/72 | HR 74 | Ht 67.0 in | Wt 225.0 lb

## 2017-03-13 DIAGNOSIS — Z30433 Encounter for removal and reinsertion of intrauterine contraceptive device: Secondary | ICD-10-CM

## 2017-03-13 MED ORDER — LEVONORGESTREL 20 MCG/24HR IU IUD: 1.0000 | INTRAUTERINE_SYSTEM | Freq: Once | INTRAUTERINE | Status: DC

## 2017-03-13 NOTE — Progress Notes (Signed)
    GYNECOLOGY OFFICE PROCEDURE NOTE  ZOEJANE CZECHOWSKI is a 36 y.o. Z6X0960 here for Mirena IUD replacement. Her current Mirena was inserted 5 years ago. Last pap smear was on 02/22/2017 and was normal.  IUD Insertion Procedure Note Patient iden  On bimanual exam, uterus was Anteverted.  Speculum placed in the vagina.  Cervix and IUD strings were visualized. The IUD strings were grasped with a uterine packing forceps and the IUD was removed easily and intact. Cervix was then cleaned with Betadine x 3. Cervix was sprayed with Hurricaine anesthetic and  grasped anteriorly with a single tooth tenaculum.  Uterus sounded to 8 cm.  Mirena  IUD placed per manufacturer's recommendations.  Strings trimmed to 3 cm. Tenaculum was removed, and silver nitrate was applied to tenaculum sites for hemostasis.  Patient tolerated procedure well.   Patient was given post-procedure instructions.   Patient was also asked to check IUD strings periodically and follow up in 4 weeks for IUD check. She is aware that this IUD expires in 5 years.  Farrel Conners, CNM 03/13/17

## 2017-04-17 ENCOUNTER — Ambulatory Visit (INDEPENDENT_AMBULATORY_CARE_PROVIDER_SITE_OTHER): Payer: Medicaid Other | Admitting: Certified Nurse Midwife

## 2017-04-17 ENCOUNTER — Encounter: Payer: Self-pay | Admitting: Certified Nurse Midwife

## 2017-04-17 VITALS — BP 118/82 | HR 72 | Wt 224.0 lb

## 2017-04-17 DIAGNOSIS — Z30431 Encounter for routine checking of intrauterine contraceptive device: Secondary | ICD-10-CM | POA: Diagnosis not present

## 2017-04-17 MED ORDER — LEVONORGESTREL 20 MCG/24HR IU IUD: 1.0000 | INTRAUTERINE_SYSTEM | Freq: Once | INTRAUTERINE | Status: DC

## 2017-04-17 NOTE — Progress Notes (Signed)
  History of Present Illness:  Robin Diaz is a 36 y.o. that had a Mirena IUD replaced approximately 1 month ago. Since that time, she states that she has had no pain. Had a few days of spotting right after the procedure, none since.  Has not felt any further LLQ pain since her last visit.  PMHx: She  has a past medical history of Hypertension; IUFD at 20 weeks or more of gestation (09/2005); Multiple sclerosis (HCC) (2015); and Rh negative, maternal. Also,  has a past surgical history that includes Breast reduction surgery (02/2002) and Intrauterine device (iud) insertion., family history includes Breast cancer (age of onset: 26) in her cousin; Breast cancer (age of onset: 27) in her paternal grandmother; Colon cancer (age of onset: 38) in her maternal uncle; Colon cancer (age of onset: 13) in her paternal grandmother; Hypertension in her father; Lung cancer in her paternal grandmother.,  reports that she has never smoked. She has never used smokeless tobacco. She reports that she does not drink alcohol or use drugs.  She has a current medication list which includes the following prescription(s): acyclovir, cholecalciferol, fingolimod hcl, hydrocortisone, ibuprofen, levonorgestrel, losartan, and vitamin b-12. Also, is allergic to gadobenate.  ROS  Physical Exam:  BP 118/82 (BP Location: Left Arm, Patient Position: Sitting, Cuff Size: Large)   Pulse 72   Wt 224 lb (101.6 kg)   BMI 35.08 kg/m  Body mass index is 35.08 kg/m. Constitutional: Well nourished, well developed female in no acute distress.   Neuro: Grossly intact Psych:  Normal mood and affect.    Pelvic exam: External/BUS: no lesion, no discharge Vagina: no lesions, no bleeding Cervix: Two IUD strings present seen coming from the cervical os.   Assessment: IUD strings present in proper location; pt doing well  Plan: RTO in 1 year for annual and prn  Farrel Conners, CNM

## 2017-04-29 NOTE — Progress Notes (Signed)
  HPI: 36 year old G3 P2012 presents for a follow up visit to review lab work results and today's ultrasound results. She presented for her annual exam last week with complaints of LLQ pain and hot flashes. She currently has a MIrena IUD for contraception which is expiring next month. Her usually has spotting monthly on the IUD that lasts one day. She is not currently having LLQ pain. On her most recent episode of pain, she also had hematuria. She does have a history or urolithiasis.  Ultrasound demonstrates: EM stripe 3.83 mm, IUD in normal position, left simple cyst 25x24 mm. No FF These findings are normal. FSH and LH were both elevated 43&37and she is probably perimenopausal PMHx: She  has a past medical history of Hypertension; IUFD at 20 weeks or more of gestation (09/2005); Multiple sclerosis (HCC) (2015); and Rh negative, maternal. Also,  has a past surgical history that includes Breast reduction surgery (02/2002) and Intrauterine device (iud) insertion., family history includes Breast cancer (age of onset: 73) in her cousin; Breast cancer (age of onset: 66) in her paternal grandmother; Colon cancer (age of onset: 6) in her maternal uncle; Colon cancer (age of onset: 43) in her paternal grandmother; Hypertension in her father; Lung cancer in her paternal grandmother.,  reports that she has never smoked. She has never used smokeless tobacco. She reports that she does not drink alcohol or use drugs.  She has a current medication list which includes the following prescription(s): acyclovir, cholecalciferol, fingolimod hcl, hydrocortisone, ibuprofen, levonorgestrel, losartan, and vitamin b-12. Also, is allergic to gadobenate.  ROS  Objective: BP 128/86   Pulse 72   Ht 5\' 7"  (1.702 m)   Wt 223 lb (101.2 kg)   BMI 34.93 kg/m   Physical examination Constitutional NAD, Conversant  Skin No rashes, lesions or ulceration.   Extremities: Moves all appropriately.  Normal ROM for age.   Neuro:  Grossly intact  Psych: Oriented to PPT.  Normal mood. Normal affect.   Assessment/ Plan:  LLQ pain-suspect urolithiasis as etiology. Doubt follicular cyst as cause of LLQ pain Perimenopausal-would proceed with IUD replacement next month  Farrel Conners, CNM

## 2018-01-02 NOTE — Progress Notes (Signed)
error 

## 2019-10-08 ENCOUNTER — Other Ambulatory Visit: Payer: Self-pay | Admitting: Adult Health

## 2019-10-08 MED ORDER — NITROFURANTOIN MONOHYD MACRO 100 MG PO CAPS: 100.0000 mg | ORAL_CAPSULE | Freq: Two times a day (BID) | ORAL | 0 refills | Status: AC

## 2019-10-08 MED ORDER — FLUCONAZOLE 150 MG PO TABS: 150.0000 mg | ORAL_TABLET | ORAL | 0 refills | Status: DC | PRN

## 2019-10-08 NOTE — Progress Notes (Signed)
Macrobid and Diflucan sent for patient for UTI and yeast infection.

## 2020-05-09 ENCOUNTER — Emergency Department
Admission: EM | Admit: 2020-05-09 | Discharge: 2020-05-10 | Disposition: A | Discharge status: Home / Self Care | Payer: Medicare Other | Attending: Emergency Medicine | Admitting: Emergency Medicine

## 2020-05-09 ENCOUNTER — Emergency Department: Payer: Medicare Other

## 2020-05-09 ENCOUNTER — Other Ambulatory Visit: Payer: Self-pay

## 2020-05-09 DIAGNOSIS — I10 Essential (primary) hypertension: Secondary | ICD-10-CM | POA: Insufficient documentation

## 2020-05-09 DIAGNOSIS — Z79899 Other long term (current) drug therapy: Secondary | ICD-10-CM | POA: Diagnosis not present

## 2020-05-09 DIAGNOSIS — Z7952 Long term (current) use of systemic steroids: Secondary | ICD-10-CM | POA: Insufficient documentation

## 2020-05-09 DIAGNOSIS — U071 COVID-19: Secondary | ICD-10-CM | POA: Insufficient documentation

## 2020-05-09 DIAGNOSIS — Z7951 Long term (current) use of inhaled steroids: Secondary | ICD-10-CM | POA: Insufficient documentation

## 2020-05-09 LAB — URINALYSIS, COMPLETE (UACMP) WITH MICROSCOPIC
Bilirubin Urine: NEGATIVE
Glucose, UA: NEGATIVE mg/dL
Ketones, ur: 5 mg/dL — AB
Leukocytes,Ua: NEGATIVE
Nitrite: NEGATIVE
Protein, ur: 100 mg/dL — AB
RBC / HPF: >50 RBC/hpf — ABNORMAL HIGH (ref 0–5)
Specific Gravity, Urine: 1.02 (ref 1.005–1.030)
pH: 6 (ref 5.0–8.0)

## 2020-05-09 LAB — COMPREHENSIVE METABOLIC PANEL
ALT: 124 U/L — ABNORMAL HIGH (ref 0–44)
AST: 95 U/L — ABNORMAL HIGH (ref 15–41)
Albumin: 3.7 g/dL (ref 3.5–5.0)
Alkaline Phosphatase: 121 U/L (ref 38–126)
Anion gap: 11 (ref 5–15)
BUN: 17 mg/dL (ref 6–20)
CO2: 22 mmol/L (ref 22–32)
Calcium: 8.4 mg/dL — ABNORMAL LOW (ref 8.9–10.3)
Chloride: 106 mmol/L (ref 98–111)
Creatinine, Ser: 0.8 mg/dL (ref 0.44–1.00)
GFR calc Af Amer: >60 mL/min (ref >60)
GFR calc non Af Amer: >60 mL/min (ref >60)
Glucose, Bld: 121 mg/dL — ABNORMAL HIGH (ref 70–99)
Potassium: 3.5 mmol/L (ref 3.5–5.1)
Sodium: 139 mmol/L (ref 135–145)
Total Bilirubin: 0.9 mg/dL (ref 0.3–1.2)
Total Protein: 6.9 g/dL (ref 6.5–8.1)

## 2020-05-09 LAB — CBC WITH DIFFERENTIAL/PLATELET
Abs Immature Granulocytes: 0.02 10*3/uL (ref 0.00–0.07)
Basophils Absolute: 0 10*3/uL (ref 0.0–0.1)
Basophils Relative: 0 %
Eosinophils Absolute: 0 10*3/uL (ref 0.0–0.5)
Eosinophils Relative: 0 %
HCT: 35.4 % — ABNORMAL LOW (ref 36.0–46.0)
Hemoglobin: 12.4 g/dL (ref 12.0–15.0)
Immature Granulocytes: 1 %
Lymphocytes Relative: 6 %
Lymphs Abs: 0.2 10*3/uL — ABNORMAL LOW (ref 0.7–4.0)
MCH: 29.5 pg (ref 26.0–34.0)
MCHC: 35 g/dL (ref 30.0–36.0)
MCV: 84.1 fL (ref 80.0–100.0)
Monocytes Absolute: 0.3 10*3/uL (ref 0.1–1.0)
Monocytes Relative: 8 %
Neutro Abs: 2.9 10*3/uL (ref 1.7–7.7)
Neutrophils Relative %: 85 %
Platelets: 164 10*3/uL (ref 150–400)
RBC: 4.21 MIL/uL (ref 3.87–5.11)
RDW: 11.9 % (ref 11.5–15.5)
WBC: 3.4 10*3/uL — ABNORMAL LOW (ref 4.0–10.5)
nRBC: 0 % (ref 0.0–0.2)

## 2020-05-09 LAB — LACTIC ACID, PLASMA: Lactic Acid, Venous: 0.8 mmol/L (ref 0.5–1.9)

## 2020-05-09 LAB — RESP PANEL BY RT PCR (RSV, FLU A&B, COVID)
Influenza A by PCR: NEGATIVE
Influenza B by PCR: NEGATIVE
Respiratory Syncytial Virus by PCR: NEGATIVE
SARS Coronavirus 2 by RT PCR: POSITIVE — AB

## 2020-05-09 LAB — PROCALCITONIN: Procalcitonin: <0.1 ng/mL

## 2020-05-09 MED ORDER — ONDANSETRON HCL 4 MG/2ML IJ SOLN
4.0000 mg | Freq: Once | INTRAMUSCULAR | Status: AC
  Administered 2020-05-09: 4 mg via INTRAVENOUS
  Filled 2020-05-09: qty 2

## 2020-05-09 MED ORDER — FLUTICASONE PROPIONATE 50 MCG/ACT NA SUSP
1.0000 | Freq: Two times a day (BID) | NASAL | 0 refills | Status: DC
Start: 2020-05-09 — End: 2022-09-29

## 2020-05-09 MED ORDER — BENZONATATE 100 MG PO CAPS: 100.0000 mg | ORAL_CAPSULE | Freq: Four times a day (QID) | ORAL | 0 refills | Status: AC | PRN

## 2020-05-09 MED ORDER — PREDNISONE 10 MG PO TABS: 10.0000 mg | ORAL_TABLET | Freq: Every day | ORAL | 0 refills | Status: DC

## 2020-05-09 MED ORDER — ONDANSETRON 4 MG PO TBDP
4.0000 mg | ORAL_TABLET | Freq: Three times a day (TID) | ORAL | 0 refills | Status: DC | PRN
Start: 2020-05-09 — End: 2022-09-29

## 2020-05-09 MED ORDER — SODIUM CHLORIDE 0.9 % IV BOLUS
1000.0000 mL | Freq: Once | INTRAVENOUS | Status: AC
  Administered 2020-05-09: 1000 mL via INTRAVENOUS

## 2020-05-09 NOTE — ED Notes (Signed)
Pt states she is covid positive and that she has not been able to eat or keep food down. Pt states having nausea and vomiting. Pt states having fevers, cough, shob.

## 2020-05-09 NOTE — ED Provider Notes (Signed)
[facility] Emergency Department Provider Note  ____________________________________________  Time seen: Approximately 7:46 PM  I have reviewed the triage vital signs and the nursing notes.   HISTORY  Chief Complaint COVID+    HPI Robin Diaz is a 39 y.o. female who presents the emergency department with a positive Covid result complaining of headache, body aches, nasal congestion, sore throat, coughing, nausea, diarrhea.  Patient states that she feels "awful" states that this is the "worst I have ever felt."  Patient states that her mother and her son had contracted Covid approximately 2 weeks ago and when she developed symptoms 5 days ago she was tested with positive results.  Patient has MS and states that between her MS, Covid she has done nothing but lay in bed since her positive diagnosis.  She is still eating and drinking.  Patient states that today she had considerable vomiting but was still able to maintain fluid intake.  No hematemesis.  No hematochezia.  No frank shortness of breath.  No chest pain.  No abdominal pain.         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 Authorizing Provider  acyclovir (ZOVIRAX) 400 MG tablet Take by mouth. 12/01/15   [provider]  benzonatate (TESSALON PERLES) 100 MG capsule Take 1 capsule (100 mg total) by mouth every 6 (six) hours  as needed for cough. 05/09/20 05/09/21  Camera Krienke, Delorise Royals, PA-C  Cholecalciferol (VITAMIN D-1000 MAX ST) 1000 units tablet Take by mouth.    [provider]  Fingolimod HCl (GILENYA) 0.5 MG CAPS Take by mouth. 12/12/16 12/12/17  [provider]  fluconazole (DIFLUCAN) 150 MG tablet Take 1 tablet (150 mg total) by mouth every three (3) days as needed. 10/08/19   Johnna Acosta, NP  fluticasone (FLONASE) 50 MCG/ACT nasal spray Place 1 spray into both nostrils 2 (two) times daily. 05/09/20   Vernal Hritz, Delorise Royals, PA-C  ibuprofen (ADVIL,MOTRIN) 200 MG tablet Take by mouth.    [provider]  levonorgestrel (MIRENA) 20 MCG/24HR IUD 1 Intra Uterine Device (1 each total) by Intrauterine route once. 03/13/17 03/13/17  Farrel Conners, CNM  losartan (COZAAR) 25 MG tablet Take by mouth. 10/18/16 10/18/17  [provider]  ondansetron (ZOFRAN-ODT) 4 MG disintegrating tablet Take 1 tablet (4 mg total) by mouth every 8 (eight) hours as needed for nausea or vomiting. 05/09/20   Lasonja Lakins, Delorise Royals, PA-C  predniSONE (DELTASONE) 10 MG tablet Take 1 tablet (10 mg total) by mouth daily. 05/09/20   Thurza Kwiecinski, Delorise Royals, PA-C  vitamin B-12 (CYANOCOBALAMIN) 1000 MCG tablet Take by mouth.    [provider]    Allergies Gadobenate  Family History  Problem Relation Age of Onset   Hypertension Father    Colon cancer Maternal Uncle 76   Breast cancer Paternal Grandmother 60  second   Colon cancer Paternal Grandmother 76       first   Lung cancer Paternal Grandmother        third   Breast cancer Cousin 45       paternal cousin    Social History Social History   Tobacco Use   Smoking status: Never Smoker   Smokeless tobacco: Never Used  Building services engineer Use: Never used  Substance Use Topics   Alcohol use: No   Drug use: No    Comment: Former MJ use     Review of Systems  Constitutional: Positive fever/chills.  Positive for body  aches. Eyes: No visual changes. No discharge ENT: N positive for nasal congestion and sore throat Cardiovascular: no chest pain. Respiratory: Positive cough. No SOB. Gastrointestinal: No abdominal pain.  Positive for nausea, vomiting, diarrhea.  No constipation. Genitourinary: Negative for dysuria. No hematuria Musculoskeletal: Negative for musculoskeletal pain. Skin: Negative for rash, abrasions, lacerations, ecchymosis. Neurological: Negative for headaches, focal weakness or numbness. 10-point ROS otherwise negative.  ____________________________________________   PHYSICAL EXAM:  VITAL SIGNS: ED Triage Vitals [05/09/20 1814]  Enc Vitals Group     BP (!) 101/57     Pulse Rate 99     Resp 18     Temp 98.9 F (37.2 C)     Temp src      SpO2 92 %     Weight 210 lb (95.3 kg)     Height 5\' 6"  (1.676 m)     Head Circumference      Peak Flow      Pain Score 0     Pain Loc      Pain Edu?      Excl. in GC?      Constitutional: Alert and oriented. Well appearing and in no acute distress. Eyes: Conjunctivae are normal. PERRL. EOMI. Head: Atraumatic. ENT:      Ears:       Nose: No congestion/rhinnorhea.      Mouth/Throat: Mucous membranes are moist.  Neck: No stridor.  No cervical spine tenderness to palpation.  Neck is supple full range of motion Hematological/Lymphatic/Immunilogical: No cervical lymphadenopathy. Cardiovascular: Normal rate, regular rhythm. Normal S1 and S2.  Good peripheral circulation. Respiratory: Normal respiratory effort without tachypnea or retractions. Lungs CTAB. Good air entry to the bases with no decreased or absent breath sounds. Gastrointestinal: Bowel sounds 4 quadrants. Soft and nontender to palpation. No guarding or rigidity. No palpable masses. No distention. No CVA tenderness. Musculoskeletal: Full range of motion to all extremities. No gross deformities appreciated. Neurologic:  Normal speech and language. No gross focal neurologic deficits  are appreciated.  Skin:  Skin is warm, dry and intact. No rash noted. Psychiatric: Mood and affect are normal. Speech and behavior are normal. Patient exhibits appropriate insight and judgement.   ____________________________________________   LABS (all labs ordered are listed, but only abnormal results are displayed)  Labs Reviewed  RESP PANEL BY RT PCR (RSV, FLU A&B, COVID) - Abnormal; Notable for the following components:      Result Value   SARS Coronavirus 2 by RT PCR POSITIVE (*)    All other components within normal limits  URINALYSIS, COMPLETE (UACMP) WITH MICROSCOPIC - Abnormal; Notable for the following components:   Color, Urine AMBER (*)    APPearance CLOUDY (*)    Hgb urine dipstick LARGE (*)    Ketones, ur 5 (*)    Protein, ur 100 (*)  RBC / HPF >50 (*)    Bacteria, UA RARE (*)    All other components within normal limits  CBC WITH DIFFERENTIAL/PLATELET - Abnormal; Notable for the following components:   WBC 3.4 (*)    HCT 35.4 (*)    Lymphs Abs 0.2 (*)    All other components within normal limits  COMPREHENSIVE METABOLIC PANEL - Abnormal; Notable for the following components:   Glucose, Bld 121 (*)    Calcium 8.4 (*)    AST 95 (*)    ALT 124 (*)    All other components within normal limits  CULTURE, BLOOD (ROUTINE X 2) W REFLEX TO ID PANEL  CULTURE, BLOOD (ROUTINE X 2) W REFLEX TO ID PANEL  LACTIC ACID, PLASMA  PROCALCITONIN  CBC WITH DIFFERENTIAL/PLATELET   ____________________________________________  EKG   ____________________________________________  RADIOLOGY I personally viewed and evaluated these images as part of my medical decision making, as well as reviewing the written report by the radiologist.  DG Chest Port 1 View  Result Date: 05/09/2020 CLINICAL DATA:  COVID positive, weakness, cough, malaise EXAM: PORTABLE CHEST 1 VIEW COMPARISON:  None. FINDINGS: Portable upright chest radiograph. Lung volumes are small, over symmetric. There  are bilateral mid and lower lung zone asymmetric airspace infiltrates, most in keeping with changes of atypical infection. No pneumothorax or pleural effusion. Cardiac size within normal limits. No acute bone abnormality. IMPRESSION: Multifocal pulmonary infiltrates most in keeping with atypical infection and compatible with the given history of COVID-19 pneumonia. Electronically Signed   By: Helyn Numbers MD   On: 05/09/2020 20:36    ____________________________________________    PROCEDURES  Procedure(s) performed:    Procedures    Medications  sodium chloride 0.9 % bolus 1,000 mL (0 mLs Intravenous Stopped 05/09/20 2251)  ondansetron (ZOFRAN) injection 4 mg (4 mg Intravenous Given 05/09/20 2112)     ____________________________________________   INITIAL IMPRESSION / ASSESSMENT AND PLAN / ED COURSE  Pertinent labs & imaging results that were available during my care of the patient were reviewed by me and considered in my medical decision making (see chart for details).  Review of the [place] CSRS was performed in accordance of the [facility] prior to dispensing any controlled drugs.           Patient's diagnosis is consistent with COVID-19.  Patient presented to emergency department with multiple systems consistent with Covid.  Patient had been tested in urgent care with positive results.  This is confirmed on testing here in the emergency department.  Patient's labs are overall reassuring.  She was given fluids here in the emergency department.  Chest x-ray is consistent with COVID-19.  Vital signs are stable.  At this time no indication for admission.  Patient will be prescribed Flonase, prednisone, Tessalon Perles, Zofran for symptom relief.  Patient had been prescribed an inhaler from urgent care and she is advised to use this as well.  Tylenol and Motrin for any fevers and to help with body aches.  Continue good fluid intake.  Return precautions are discussed with the patient at this  time.  Follow-up primary care as needed.. Patient is given ED precautions to return to the ED for any worsening or new symptoms.     ____________________________________________  FINAL CLINICAL IMPRESSION(S) / ED DIAGNOSES  Final diagnoses:  COVID-19      NEW MEDICATIONS STARTED DURING THIS VISIT:  ED Discharge Orders         Ordered    fluticasone (FLONASE) 50  MCG/ACT nasal spray  2 times daily     Discontinue  Reprint     05/09/20 2343    predniSONE (DELTASONE) 10 MG tablet  Daily     Discontinue  Reprint    Note to Pharmacy: Take 6 pills x 2 days, 5 pills x 2 days, 4 pills x 2 days, 3 pills x 2 days, 2 pills x 2 days, and 1 pill x 2 days   05/09/20 2343    benzonatate (TESSALON PERLES) 100 MG capsule  Every 6 hours PRN     Discontinue  Reprint     05/09/20 2343    ondansetron (ZOFRAN-ODT) 4 MG disintegrating tablet  Every 8 hours PRN     Discontinue  Reprint     05/09/20 2343              This chart was dictated using voice recognition software/Dragon. Despite best efforts to proofread, errors can occur which can change the meaning. Any change was purely unintentional.    Racheal Patches, PA-C 05/10/20 0002    Phineas Semen, MD 05/10/20 1520

## 2020-05-09 NOTE — ED Notes (Signed)
Pt ambulatory to toilet at this time with one person assist. Pt is unsteady but ambulates on her own. Pt reports feeling off balance

## 2020-05-09 NOTE — ED Triage Notes (Signed)
Pt comes via POV from home with c/o COVID+. Pt states she was dx on Friday the patient states she has been in the bed and just doesn't feel good.  Pt states she is just so weak.  Pt states Nausea. Pt states cough that is productive.  Pt denies any belly, CP or SOB.

## 2020-05-10 ENCOUNTER — Other Ambulatory Visit: Payer: Self-pay | Admitting: Physician Assistant

## 2020-05-10 DIAGNOSIS — G35 Multiple sclerosis: Secondary | ICD-10-CM

## 2020-05-10 DIAGNOSIS — U071 COVID-19: Secondary | ICD-10-CM

## 2020-05-10 NOTE — Progress Notes (Signed)
I connected by phone with Earvin Hansen on 05/10/2020 at 3:32 PM to discuss the potential use of a new treatment for mild to moderate COVID-19 viral infection in non-hospitalized patients.  This patient is a 39 y.o. female that meets the FDA criteria for Emergency Use Authorization of COVID monoclonal antibody casirivimab/imdevimab.  Has a (+) direct SARS-CoV-2 viral test result  Has mild or moderate COVID-19   Is NOT hospitalized due to COVID-19  Is within 10 days of symptom onset  Has at least one of the high risk factor(s) for progression to severe COVID-19 and/or hospitalization as defined in EUA.  Specific high risk criteria : BMI > 25 and multiple sclerosis   I have spoken and communicated the following to the patient or parent/caregiver regarding COVID monoclonal antibody treatment:  1. FDA has authorized the emergency use for the treatment of mild to moderate COVID-19 in adults and pediatric patients with positive results of direct SARS-CoV-2 viral testing who are 41 years of age and older weighing at least 40 kg, and who are at high risk for progressing to severe COVID-19 and/or hospitalization.  2. The significant known and potential risks and benefits of COVID monoclonal antibody, and the extent to which such potential risks and benefits are unknown.  3. Information on available alternative treatments and the risks and benefits of those alternatives, including clinical trials.  4. Patients treated with COVID monoclonal antibody should continue to self-isolate and use infection control measures (e.g., wear mask, isolate, social distance, avoid sharing personal items, clean and disinfect "high touch" surfaces, and frequent handwashing) according to CDC guidelines.   5. The patient or parent/caregiver has the option to accept or refuse COVID monoclonal antibody treatment.  After reviewing this information with the patient, The patient agreed to proceed with receiving  casirivimab\imdevimab infusion and will be provided a copy of the Fact sheet prior to receiving the infusion.   Sx onset 7/15. Set up for infusion tomorrow 7/15 @ 12:30pm. Directions to new [facility] long infusion center.    Cline Crock 05/10/2020 3:32 PM

## 2020-05-11 ENCOUNTER — Ambulatory Visit (HOSPITAL_COMMUNITY)
Admission: RE | Admit: 2020-05-11 | Discharge: 2020-05-11 | Disposition: A | Discharge status: Home / Self Care | Payer: Medicare Other | Source: Ambulatory Visit | Attending: Pulmonary Disease | Admitting: Pulmonary Disease

## 2020-05-11 DIAGNOSIS — G35 Multiple sclerosis: Secondary | ICD-10-CM | POA: Diagnosis present

## 2020-05-11 DIAGNOSIS — U071 COVID-19: Secondary | ICD-10-CM | POA: Insufficient documentation

## 2020-05-11 DIAGNOSIS — Z23 Encounter for immunization: Secondary | ICD-10-CM | POA: Diagnosis not present

## 2020-05-11 MED ORDER — DIPHENHYDRAMINE HCL 50 MG/ML IJ SOLN: 50.0000 mg | Freq: Once | INTRAMUSCULAR | Status: DC | PRN

## 2020-05-11 MED ORDER — FAMOTIDINE IN NACL 20-0.9 MG/50ML-% IV SOLN: 20.0000 mg | Freq: Once | INTRAVENOUS | Status: DC | PRN

## 2020-05-11 MED ORDER — METHYLPREDNISOLONE SODIUM SUCC 125 MG IJ SOLR: 125.0000 mg | Freq: Once | INTRAMUSCULAR | Status: DC | PRN

## 2020-05-11 MED ORDER — ALBUTEROL SULFATE HFA 108 (90 BASE) MCG/ACT IN AERS: 2.0000 | INHALATION_SPRAY | Freq: Once | RESPIRATORY_TRACT | Status: DC | PRN

## 2020-05-11 MED ORDER — ONDANSETRON HCL 4 MG/2ML IJ SOLN
4.0000 mg | Freq: Once | INTRAMUSCULAR | Status: AC
  Administered 2020-05-11: 4 mg via INTRAVENOUS
  Filled 2020-05-11: qty 2

## 2020-05-11 MED ORDER — SODIUM CHLORIDE 0.9 % IV SOLN
Freq: Once | INTRAVENOUS | Status: AC
  Filled 2020-05-11: qty 600

## 2020-05-11 MED ORDER — EPINEPHRINE 0.3 MG/0.3ML IJ SOAJ: 0.3000 mg | Freq: Once | INTRAMUSCULAR | Status: DC | PRN

## 2020-05-11 MED ORDER — SODIUM CHLORIDE 0.9 % IV SOLN: INTRAVENOUS | Status: DC | PRN

## 2020-05-11 NOTE — Discharge Instructions (Signed)

## 2020-05-14 LAB — CULTURE, BLOOD (ROUTINE X 2)
Culture: NO GROWTH
Culture: NO GROWTH
Special Requests: ADEQUATE
Special Requests: ADEQUATE

## 2020-08-24 ENCOUNTER — Other Ambulatory Visit: Payer: Self-pay | Admitting: Medical Oncology

## 2020-08-24 ENCOUNTER — Other Ambulatory Visit: Payer: PRIVATE HEALTH INSURANCE

## 2020-08-24 DIAGNOSIS — R310 Gross hematuria: Secondary | ICD-10-CM

## 2020-08-24 DIAGNOSIS — R109 Unspecified abdominal pain: Secondary | ICD-10-CM

## 2020-08-25 ENCOUNTER — Ambulatory Visit: Admission: RE | Admit: 2020-08-25 | Payer: Medicare Other | Source: Ambulatory Visit

## 2021-01-19 IMAGING — DX DG CHEST 1V PORT
1 series · 1 of 1 positions shown · non-contrast
Comparison: None.

CLINICAL DATA: COVID positive, weakness, cough, malaise

EXAM:
PORTABLE CHEST 1 VIEW

[chest ap]
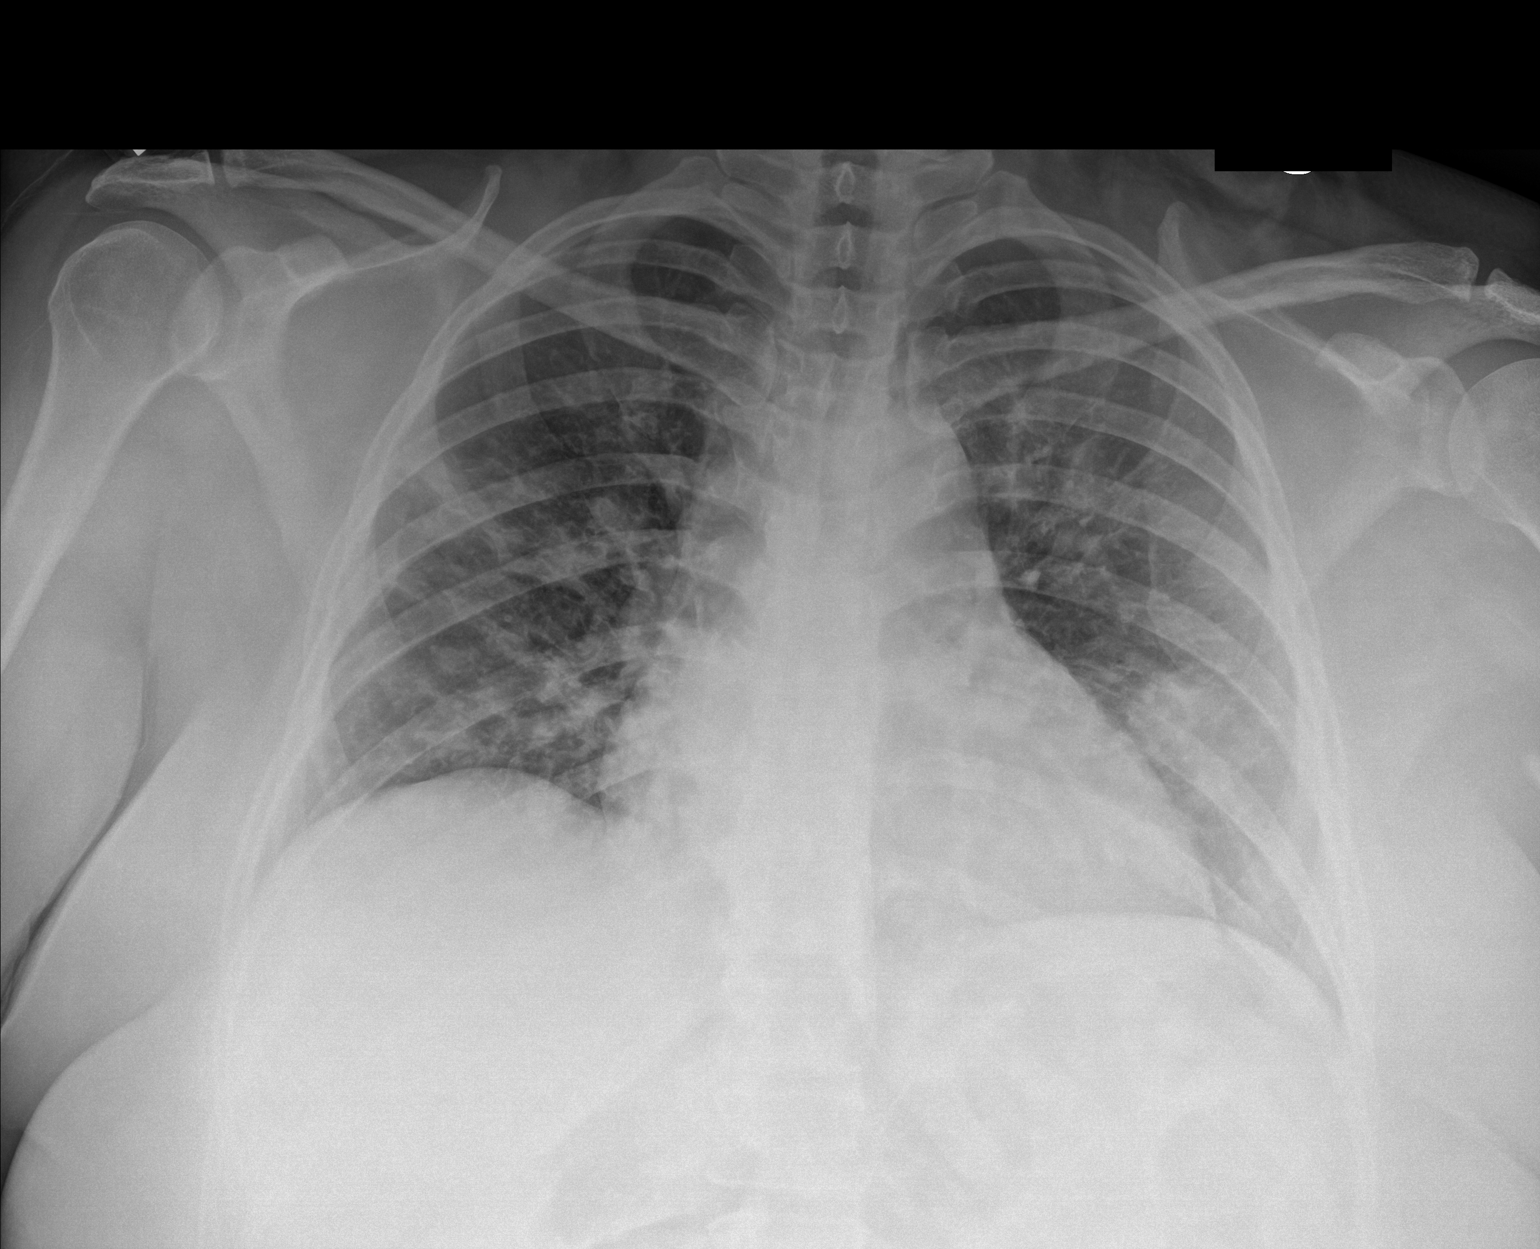

[1 of 1 positions shown; findings below may reference images not displayed]

FINDINGS: Portable upright chest radiograph.

Lung volumes are small, over symmetric. There are bilateral mid and
lower lung zone asymmetric airspace infiltrates, most in keeping
with changes of atypical infection. No pneumothorax or pleural
effusion. Cardiac size within normal limits. No acute bone
abnormality.
IMPRESSION: Multifocal pulmonary infiltrates most in keeping with atypical
infection and compatible with the given history of [id number]
pneumonia.

## 2022-09-29 ENCOUNTER — Telehealth: Payer: Medicare Other | Admitting: Family

## 2022-09-29 DIAGNOSIS — R399 Unspecified symptoms and signs involving the genitourinary system: Secondary | ICD-10-CM

## 2022-09-29 MED ORDER — CEPHALEXIN 500 MG PO CAPS: 500.0000 mg | ORAL_CAPSULE | Freq: Two times a day (BID) | ORAL | 0 refills | Status: DC

## 2022-09-29 NOTE — Progress Notes (Signed)
Virtual Visit Consent   [name], you are scheduled for a virtual visit with a [facility] provider today. Just as with appointments in the office, your consent must be obtained to participate. Your consent will be active for this visit and any virtual visit you may have with one of our providers in the next 365 days. If you have a MyChart account, a copy of this consent can be sent to you electronically.  As this is a virtual visit, video technology does not allow for your provider to perform a traditional examination. This may limit your provider's ability to fully assess your condition. If your provider identifies any concerns that need to be evaluated in person or the need to arrange testing (such as labs, EKG, etc.), we will make arrangements to do so. Although advances in technology are sophisticated, we cannot ensure that it will always work on either your end or our end. If the connection with a video visit is poor, the visit may have to be switched to a telephone visit. With either a video or telephone visit, we are not always able to ensure that we have a secure connection.  By engaging in this virtual visit, you consent to the provision of healthcare and authorize for your insurance to be billed (if applicable) for the services provided during this visit. Depending on your insurance coverage, you may receive a charge related to this service.  I need to obtain your verbal consent now. Are you willing to proceed with your visit today? Robin Diaz has provided verbal consent on 09/29/2022 for a virtual visit (video or telephone). Jannifer Rodney, FNP  Date: 09/29/2022 4:29 PM  Virtual Visit via Video Note   I, Jannifer Rodney, connected with  Robin Diaz  ([id number], [birth year]) on 09/29/22 at  4:45 PM EST by a video-enabled telemedicine application and verified that I am speaking with the correct person using two identifiers.  Location: Patient: Virtual Visit Location  Patient: Home Provider: Virtual Visit Location Provider: Home   I discussed the limitations of evaluation and management by telemedicine and the availability of in person appointments. The patient expressed understanding and agreed to proceed.    History of Present Illness: Robin Diaz is a 41 y.o. who identifies as a female who was assigned female at birth, and is being seen today for UTI symptoms .  HPI: Dysuria  This is a new problem. The current episode started in the past 7 days. The problem occurs intermittently. The problem has been waxing and waning. The quality of the pain is described as burning and aching (pressure). The pain is at a severity of 7/10. The pain is moderate. There has been no fever. Associated symptoms include frequency, hesitancy and urgency. Pertinent negatives include no chills, hematuria, nausea or vomiting. She has tried increased fluids (AZO) for the symptoms. The treatment provided mild relief.    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   Medications:  Current Outpatient Medications:    cephALEXin (KEFLEX) 500 MG capsule, Take 1 capsule (500 mg total) by mouth 2 (two) times daily., Disp: 14 capsule, Rfl: 0   acyclovir (ZOVIRAX) 400 MG tablet, Take by mouth., Disp: , Rfl:    Cholecalciferol (VITAMIN  D-1000 MAX ST) 1000 units tablet, Take by mouth., Disp: , Rfl:    Fingolimod HCl (GILENYA) 0.5 MG CAPS, Take by mouth., Disp: , Rfl:    ibuprofen (ADVIL,MOTRIN) 200 MG tablet, Take by mouth., Disp: , Rfl:    levonorgestrel (MIRENA) 20 MCG/24HR IUD, 1 Intra Uterine Device (1 each total) by Intrauterine route once., Disp: 1 each, Rfl:    losartan (COZAAR) 25 MG tablet, Take by mouth., Disp: , Rfl:    OZEMPIC, 2 MG/DOSE,  8 MG/3ML SOPN, Inject into the skin., Disp: , Rfl:    vitamin B-12 (CYANOCOBALAMIN) 1000 MCG tablet, Take by mouth., Disp: , Rfl:   Observations/Objective: Patient is well-developed, well-nourished in no acute distress.  Resting comfortably  at home.  Head is normocephalic, atraumatic.  No labored breathing. Speech is clear and coherent with logical content.  Patient is alert and oriented at baseline.    Assessment and Plan: 1. UTI symptoms - cephALEXin (KEFLEX) 500 MG capsule; Take 1 capsule (500 mg total) by mouth 2 (two) times daily.  Dispense: 14 capsule; Refill: 0  Force fluids AZO over the counter X2 days Follow up if symptoms worsen or do not improve   Follow Up Instructions: I discussed the assessment and treatment plan with the patient. The patient was provided an opportunity to ask questions and all were answered. The patient agreed with the plan and demonstrated an understanding of the instructions.  A copy of instructions were sent to the patient via MyChart unless otherwise noted below.    The patient was advised to call back or seek an in-person evaluation if the symptoms worsen or if the condition fails to improve as anticipated.  Time:  I spent 7 minutes with the patient via telehealth technology discussing the above problems/concerns.    Jannifer Rodney, FNP

## 2022-10-10 ENCOUNTER — Telehealth: Payer: Medicare Other | Admitting: Family Medicine

## 2022-10-10 DIAGNOSIS — R399 Unspecified symptoms and signs involving the genitourinary system: Secondary | ICD-10-CM

## 2022-10-10 NOTE — Progress Notes (Signed)
[place]   Failed UTI treatment from 09/29/22 with Keflex- needs to be seen in person to get urine sample prior to starting ned medication .  Patient acknowledged agreement and understanding of the plan.

## 2022-10-12 ENCOUNTER — Ambulatory Visit: Payer: Medicare Other

## 2022-10-19 ENCOUNTER — Emergency Department: Payer: Medicare Other

## 2022-10-19 ENCOUNTER — Observation Stay
Admission: EM | Admit: 2022-10-19 | Discharge: 2022-10-22 | Disposition: A | Discharge status: Home / Self Care | Payer: Medicare Other | Attending: Internal Medicine | Admitting: Internal Medicine

## 2022-10-19 ENCOUNTER — Encounter: Payer: Self-pay | Admitting: Internal Medicine

## 2022-10-19 ENCOUNTER — Observation Stay: Payer: Medicare Other

## 2022-10-19 ENCOUNTER — Other Ambulatory Visit: Payer: Self-pay

## 2022-10-19 DIAGNOSIS — R310 Gross hematuria: Secondary | ICD-10-CM | POA: Insufficient documentation

## 2022-10-19 DIAGNOSIS — G35 Multiple sclerosis: Secondary | ICD-10-CM | POA: Diagnosis not present

## 2022-10-19 DIAGNOSIS — B9620 Unspecified Escherichia coli [E. coli] as the cause of diseases classified elsewhere: Secondary | ICD-10-CM | POA: Diagnosis not present

## 2022-10-19 DIAGNOSIS — I10 Essential (primary) hypertension: Secondary | ICD-10-CM | POA: Diagnosis not present

## 2022-10-19 DIAGNOSIS — R300 Dysuria: Secondary | ICD-10-CM | POA: Diagnosis present

## 2022-10-19 DIAGNOSIS — R399 Unspecified symptoms and signs involving the genitourinary system: Secondary | ICD-10-CM

## 2022-10-19 DIAGNOSIS — Z1152 Encounter for screening for COVID-19: Secondary | ICD-10-CM | POA: Insufficient documentation

## 2022-10-19 DIAGNOSIS — G4700 Insomnia, unspecified: Secondary | ICD-10-CM | POA: Diagnosis not present

## 2022-10-19 DIAGNOSIS — N10 Acute tubulo-interstitial nephritis: Secondary | ICD-10-CM | POA: Insufficient documentation

## 2022-10-19 DIAGNOSIS — N200 Calculus of kidney: Secondary | ICD-10-CM | POA: Diagnosis not present

## 2022-10-19 DIAGNOSIS — N132 Hydronephrosis with renal and ureteral calculous obstruction: Secondary | ICD-10-CM | POA: Diagnosis not present

## 2022-10-19 DIAGNOSIS — J101 Influenza due to other identified influenza virus with other respiratory manifestations: Secondary | ICD-10-CM | POA: Diagnosis not present

## 2022-10-19 DIAGNOSIS — Z79899 Other long term (current) drug therapy: Secondary | ICD-10-CM | POA: Insufficient documentation

## 2022-10-19 DIAGNOSIS — N1330 Unspecified hydronephrosis: Secondary | ICD-10-CM | POA: Diagnosis present

## 2022-10-19 DIAGNOSIS — N390 Urinary tract infection, site not specified: Secondary | ICD-10-CM | POA: Diagnosis not present

## 2022-10-19 DIAGNOSIS — Q6211 Congenital occlusion of ureteropelvic junction: Secondary | ICD-10-CM

## 2022-10-19 LAB — URINALYSIS, ROUTINE W REFLEX MICROSCOPIC
Bilirubin Urine: NEGATIVE
Glucose, UA: NEGATIVE mg/dL
Ketones, ur: NEGATIVE mg/dL
Nitrite: POSITIVE — AB
Protein, ur: >=300 mg/dL — AB
RBC / HPF: >50 RBC/hpf — ABNORMAL HIGH (ref 0–5)
Specific Gravity, Urine: 1.01 (ref 1.005–1.030)
WBC, UA: >50 WBC/hpf — ABNORMAL HIGH (ref 0–5)
pH: 9 — ABNORMAL HIGH (ref 5.0–8.0)

## 2022-10-19 LAB — BASIC METABOLIC PANEL
Anion gap: 9 (ref 5–15)
BUN: 14 mg/dL (ref 6–20)
CO2: 25 mmol/L (ref 22–32)
Calcium: 9.3 mg/dL (ref 8.9–10.3)
Chloride: 107 mmol/L (ref 98–111)
Creatinine, Ser: 0.89 mg/dL (ref 0.44–1.00)
GFR, Estimated: >60 mL/min (ref >60)
Glucose, Bld: 100 mg/dL — ABNORMAL HIGH (ref 70–99)
Potassium: 4.5 mmol/L (ref 3.5–5.1)
Sodium: 141 mmol/L (ref 135–145)

## 2022-10-19 LAB — RESP PANEL BY RT-PCR (RSV, FLU A&B, COVID)  RVPGX2
Influenza A by PCR: POSITIVE — AB
Influenza B by PCR: NEGATIVE
Resp Syncytial Virus by PCR: NEGATIVE
SARS Coronavirus 2 by RT PCR: NEGATIVE

## 2022-10-19 LAB — CBC WITH DIFFERENTIAL/PLATELET
Abs Immature Granulocytes: 0.03 10*3/uL (ref 0.00–0.07)
Basophils Absolute: 0 10*3/uL (ref 0.0–0.1)
Basophils Relative: 0 %
Eosinophils Absolute: 0 10*3/uL (ref 0.0–0.5)
Eosinophils Relative: 0 %
HCT: 37.9 % (ref 36.0–46.0)
Hemoglobin: 12.5 g/dL (ref 12.0–15.0)
Immature Granulocytes: 0 %
Lymphocytes Relative: 11 %
Lymphs Abs: 1.2 10*3/uL (ref 0.7–4.0)
MCH: 28.9 pg (ref 26.0–34.0)
MCHC: 33 g/dL (ref 30.0–36.0)
MCV: 87.5 fL (ref 80.0–100.0)
Monocytes Absolute: 0.8 10*3/uL (ref 0.1–1.0)
Monocytes Relative: 8 %
Neutro Abs: 8.2 10*3/uL — ABNORMAL HIGH (ref 1.7–7.7)
Neutrophils Relative %: 81 %
Platelets: 313 10*3/uL (ref 150–400)
RBC: 4.33 MIL/uL (ref 3.87–5.11)
RDW: 11.4 % — ABNORMAL LOW (ref 11.5–15.5)
WBC: 10.2 10*3/uL (ref 4.0–10.5)
nRBC: 0 % (ref 0.0–0.2)

## 2022-10-19 LAB — POC URINE PREG, ED: Preg Test, Ur: NEGATIVE

## 2022-10-19 MED ORDER — KETOROLAC TROMETHAMINE 30 MG/ML IJ SOLN: 30.0000 mg | Freq: Once | INTRAMUSCULAR | Status: DC

## 2022-10-19 MED ORDER — SODIUM CHLORIDE 0.9 % IV BOLUS
1000.0000 mL | Freq: Once | INTRAVENOUS | Status: AC
  Administered 2022-10-19: 1000 mL via INTRAVENOUS

## 2022-10-19 MED ORDER — HYDROMORPHONE HCL 1 MG/ML IJ SOLN: 1.0000 mg | Freq: Once | INTRAMUSCULAR | Status: DC

## 2022-10-19 MED ORDER — ESCITALOPRAM OXALATE 10 MG PO TABS
20.0000 mg | ORAL_TABLET | Freq: Every day | ORAL | Status: DC
  Administered 2022-10-19 – 2022-10-21 (×3): 20 mg via ORAL
  Filled 2022-10-19 (×4): qty 2

## 2022-10-19 MED ORDER — ONDANSETRON HCL 4 MG/2ML IJ SOLN
4.0000 mg | Freq: Once | INTRAMUSCULAR | Status: AC
  Administered 2022-10-19: 4 mg via INTRAVENOUS
  Filled 2022-10-19: qty 2

## 2022-10-19 MED ORDER — CEFTRIAXONE SODIUM 1 G IJ SOLR: 1.0000 g | Freq: Once | INTRAMUSCULAR | Status: DC

## 2022-10-19 MED ORDER — MELATONIN 5 MG PO TABS
5.0000 mg | ORAL_TABLET | Freq: Every day | ORAL | Status: DC
  Administered 2022-10-19 – 2022-10-21 (×3): 5 mg via ORAL
  Filled 2022-10-19 (×3): qty 1

## 2022-10-19 MED ORDER — IBUPROFEN 800 MG PO TABS
800.0000 mg | ORAL_TABLET | Freq: Once | ORAL | Status: AC
  Administered 2022-10-19: 800 mg via ORAL
  Filled 2022-10-19: qty 1

## 2022-10-19 MED ORDER — FENTANYL CITRATE PF 50 MCG/ML IJ SOSY
50.0000 ug | PREFILLED_SYRINGE | Freq: Once | INTRAMUSCULAR | Status: AC
  Administered 2022-10-19: 50 ug via INTRAVENOUS
  Filled 2022-10-19: qty 1

## 2022-10-19 MED ORDER — SODIUM CHLORIDE 0.9 % IV SOLN
1.0000 g | INTRAVENOUS | Status: DC
  Administered 2022-10-19: 1 g via INTRAVENOUS
  Filled 2022-10-19: qty 10

## 2022-10-19 MED ORDER — SODIUM CHLORIDE 0.9 % IV SOLN
2.0000 g | INTRAVENOUS | Status: DC
  Administered 2022-10-20 – 2022-10-21 (×2): 2 g via INTRAVENOUS
  Filled 2022-10-19 (×3): qty 20

## 2022-10-19 MED ORDER — BENZONATATE 100 MG PO CAPS
100.0000 mg | ORAL_CAPSULE | Freq: Three times a day (TID) | ORAL | Status: DC | PRN
  Administered 2022-10-19 – 2022-10-21 (×3): 100 mg via ORAL
  Filled 2022-10-19 (×3): qty 1

## 2022-10-19 NOTE — ED Notes (Signed)
Report given to [place], rn

## 2022-10-19 NOTE — ED Provider Notes (Signed)
[facility] Emergency Department Provider Note     Event Date/Time   First MD Initiated Contact with Patient 10/19/22 1616     (approximate)   History   Dysuria   HPI  Robin Diaz is a 41 y.o. female with a history of MS, complains of dysuria and urinary frequency.  She has completed 2 courses of antibiotics over the last 3 weeks (Keflex & Macrobid) via televisits, but has not had any urine samples collected or tested.  She reports ongoing symptoms after completing the antibiotics.  She denies any vomiting, hematuria, or flank pain. She endorses a history of kidney stones.      Physical Exam   Triage Vital Signs: ED Triage Vitals [10/19/22 1219]  Enc Vitals Group     BP 105/71     Pulse Rate (!) 105     Resp 16     Temp 98.4 F (36.9 C)     Temp Source Oral     SpO2 98 %     Weight 185 lb (83.9 kg)     Height 5\' 7"  (1.702 m)     Head Circumference      Peak Flow      Pain Score 5     Pain Loc      Pain Edu?      Excl. in GC?     Most recent vital signs: Vitals:   10/19/22 1219 10/19/22 1519  BP: 105/71 114/72  Pulse: (!) 105 89  Resp: 16 20  Temp: 98.4 F (36.9 C)   SpO2: 98% 100%    General Awake, no distress. NAD CV:  Good peripheral perfusion.  RESP:  Normal effort.  ABD:  No distention. Mild bilateral CVA tenderness  ED Results / Procedures / Treatments   Labs (all labs ordered are listed, but only abnormal results are displayed) Labs Reviewed  URINALYSIS, ROUTINE W REFLEX MICROSCOPIC - Abnormal; Notable for the following components:      Result Value   Color, Urine YELLOW (*)    APPearance TURBID (*)    pH 9.0 (*)    Hgb urine dipstick MODERATE (*)    Protein, ur >=300 (*)    Nitrite POSITIVE (*)    Leukocytes,Ua LARGE (*)    RBC / HPF >50 (*)    WBC, UA >50 (*)    Bacteria, UA MANY (*)    All other components within normal limits  BASIC METABOLIC PANEL - Abnormal; Notable for the following components:    Glucose, Bld 100 (*)    All other components within normal limits  CBC WITH DIFFERENTIAL/PLATELET - Abnormal; Notable for the following components:   RDW 11.4 (*)    Neutro Abs 8.2 (*)    All other components within normal limits  URINE CULTURE  POC URINE PREG, ED     EKG   RADIOLOGY  I personally viewed and evaluated these images as part of my medical decision making, as well as reviewing the written report by the radiologist.  ED Provider Interpretation: large bilateral renal pelvic stones noted. Hydronephrosis bilaterally  CT Renal Stone Study  Result Date: 10/19/2022 CLINICAL DATA:  Abdominal pain. Flank pain. Foul smelling urine. Bladder region pain. EXAM: CT ABDOMEN AND PELVIS WITHOUT CONTRAST TECHNIQUE: Multidetector CT imaging of the abdomen and pelvis was performed following the standard protocol without IV contrast. RADIATION DOSE REDUCTION: This exam was performed according to the departmental dose-optimization program which includes automated exposure control, adjustment  of the mA and/or kV according to patient size and/or use of iterative reconstruction technique. COMPARISON:  10/05/2012. FINDINGS: Lower chest: Clear lung bases. Hepatobiliary: No focal liver abnormality is seen. No gallstones, gallbladder wall thickening, or biliary dilatation. Pancreas: Unremarkable. No pancreatic ductal dilatation or surrounding inflammatory changes. Spleen: Normal in size without focal abnormality. Adrenals/Urinary Tract: Normal adrenal glands. Moderate, right greater than left, hydronephrosis. Large stones lie in both renal pelvises, crossing the ureteropelvic junctions, both 2.2 cm in size. Several small stones noted in the lower poles of both kidneys. Mild bilateral perinephric stranding, adjacent to intrarenal collecting systems and along the inferior aspect of the right kidney. No abscess. No renal mass. Ureters are normal in course and in caliber. No ureteral stones. Bladder minimally  distended. No convincing wall thickening, stone or mass. Stomach/Bowel: Normal stomach. Small bowel and colon are normal in caliber. No wall thickening. No inflammation. No evidence of appendicitis. Vascular/Lymphatic: No significant vascular findings are present. No enlarged abdominal or pelvic lymph nodes. Reproductive: Well-positioned IUD. Uterus otherwise unremarkable. No adnexal masses. Other: No abdominal wall hernia or abnormality. No abdominopelvic ascites. Musculoskeletal: No fracture or acute finding.  No bone lesion. IMPRESSION: 1. Moderate, right greater than left, bilateral hydronephrosis. This appears due to to large bilateral renal pelvis stones causing ball valve type obstructions at the ureteropelvic junctions. No other acute abnormality. 2. No ureteral stone or dilation. 3. Additional small nonobstructing stones in both kidneys. Electronically Signed   By: Amie Portland M.D.   On: 10/19/2022 13:05     PROCEDURES:  Critical Care performed: No  Procedures   MEDICATIONS ORDERED IN ED: Medications  cefTRIAXone (ROCEPHIN) 1 g in sodium chloride 0.9 % 100 mL IVPB (1 g Intravenous New Bag/Given 10/19/22 1654)  sodium chloride 0.9 % bolus 1,000 mL (1,000 mLs Intravenous New Bag/Given 10/19/22 1646)  ondansetron (ZOFRAN) injection 4 mg (4 mg Intravenous Given 10/19/22 1648)  fentaNYL (SUBLIMAZE) injection 50 mcg (50 mcg Intravenous Given 10/19/22 1650)     IMPRESSION / MDM / ASSESSMENT AND PLAN / ED COURSE  I reviewed the triage vital signs and the nursing notes.                              Differential diagnosis includes, but is not limited to, nephrosis, complicated UTI, pyelonephritis, renal calculi, MSK etiology  ----------------------------------------- 5:57 PM on 10/19/2022 ----------------------------------------- S/w Stoioff: options for management depend on pain control and patient preference. Patient may either stay for stent placement overnight or RTED on Tuesday for  surgical intervention. Patient reporting significant pressure and fullness with incomplete voiding.   ----------------------------------------- 7:02 PM on 10/19/2022 ----------------------------------------- Patient consulted with her mother, has decided due to concern for ongoing pain control to stay in the hospital overnight for surgical intervention in the morning.  Dr. Lonna Cobb is aware of the patient's decision, and will add the patient to schedule for the a.m.  He is asked that hospitalist admit patient for ongoing IV antibiotic and pain management.  Patient's presentation is most consistent with acute complicated illness / injury requiring diagnostic workup.  Patient's diagnosis is consistent with bilateral obstructive renal calculi with bilateral hydronephrosis and complicated UTI. Patient will be admitted to the hospital service.  Stoioff will take the patient to the OR in the morning for stent placement.  FINAL CLINICAL IMPRESSION(S) / ED DIAGNOSES   Final diagnoses:  Hydronephrosis with ureteropelvic junction (UPJ) obstruction  Acute pyelonephritis  Rx / DC Orders   ED Discharge Orders     None        Note:  This document was prepared using Dragon voice recognition software and may include unintentional dictation errors.    Lissa Hoard, PA-C 10/19/22 1905    Concha Se, MD 10/20/22 [phone]

## 2022-10-19 NOTE — ED Provider Triage Note (Addendum)
Emergency Medicine Provider Triage Evaluation Note  Robin Diaz, a 41 y.o. female  was evaluated in triage.  Pt complains of dysuria and urinary frequency.  She has completed 2 courses of antibiotics (Keflex & Macrobid) via televisits, but has not had any urine samples collected or tested.  She reports ongoing symptoms after completing the antibiotic.  She denies any vomiting, hematuria, or flank pain. She endorses a history of kidney stones.   Review of Systems  Positive: dysuria Negative: NVD  Physical Exam  BP 105/71 (BP Location: Right Arm)   Pulse (!) 105   Temp 98.4 F (36.9 C) (Oral)   Resp 16   Ht 5\' 7"  (1.702 m)   Wt 83.9 kg   SpO2 98%   BMI 28.98 kg/m  Gen:   Awake, no distress  NAD Resp:  Normal effort CTA MSK:   Moves extremities without difficulty  Other:    Medical Decision Making  Medically screening exam initiated at 12:20 PM.  Appropriate orders placed.  was informed that the remainder of the evaluation will be completed by another provider, this initial triage assessment does not replace that evaluation, and the importance of remaining in the ED until their evaluation is complete.  Patient to the ED for ongoing symptoms despite 2 rounds of antibiotics for UTI, after being treated empirically from televisit with her provider.   Earvin Hansen, PA-C 10/19/22 [address] [place] 10/19/22 1229

## 2022-10-19 NOTE — ED Notes (Signed)
Patient reports non productive cough since a week ago

## 2022-10-19 NOTE — ED Triage Notes (Signed)
Pt to ED from home. Pt did televisit for UTI about 20 days ago(12/3). Prescribed keflex. The day pt stopped keflex the symptoms came back. Called her PCP and they placed her on Macrobid. They refused to see her due to no available appts. Pt is high risk patient. She has MS and takes meds to treat MS. Pt was nauseated but no vomiting. Pt is CAOx4 and in no acute distress.

## 2022-10-19 NOTE — H&P (Signed)
History and Physical    Patient: Robin Diaz EGB:[id number] DOB: 1981/07/18 DOA: 10/19/2022 DOS: the patient was seen and examined on 10/19/2022 PCP: [facility]  Patient coming from: Home  Chief Complaint:  Chief Complaint  Patient presents with   Dysuria   HPI: Robin Diaz is a 41 y.o. female with medical history significant of hypertension, Multiple sclerosis w recent UTI x2, (tx with oral abx, keflex, and then macrobid) apparently finished her macrobid the other day and then began having some dysuria, and odor to her urine.  Pt notes fever intermittently for which she has been using ibuprofen and some n/v and slight right flank pain, and right hip pain. .  Pt denies chills, gross hematuria,  cp, palp, sob, abd pain, diarrhea, brbpr, black stool. Pt notes taking AZO and therefore difficult to tell if there is blood ?  Pt presented due to dysuria  In ED, T 98.4, P 105-> 89, Bp 105/71  pox 98% on RA pain level 5/10  Wbc 10.2, hgb 12.5, Plt 313 Na 141, K 4.5, Bun 14, Creat 0.89  Upreg negative  Urinalysis wbc >50, rbc >50, LE +, N +  CT abd/ pelvis  IMPRESSION: 1. Moderate, right greater than left, bilateral hydronephrosis. This appears due to to large bilateral renal pelvis stones causing ball valve type obstructions at the ureteropelvic junctions. No other acute abnormality. 2. No ureteral stone or dilation. 3. Additional small nonobstructing stones in both kidneys.  ED spoke with urology and will evaluate in am for stenting NPO after MN  Pt will be admitted for nephrolithiaisis, acute lower uti ? Pyelonephritis.   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 She has never used smokeless tobacco. She reports that she does not drink alcohol and does not use drugs.  Allergies  Allergen Reactions   Gadobenate Swelling    Lower lip swelling.    Family History  Problem Relation Age of Onset   Hypertension Father    Colon cancer Maternal Uncle 52   Breast cancer Paternal Grandmother 37       second   Colon cancer Paternal Grandmother 66       first   Lung cancer Paternal Grandmother        third   Breast cancer Cousin 47       paternal cousin    Prior to Admission medications   Medication Sig Start Date End Date Taking? Authorizing Provider  acyclovir (ZOVIRAX) 400 MG tablet Take by mouth. 12/01/15   [provider]  cephALEXin (KEFLEX) 500 MG capsule Take 1 capsule (500 mg total) by mouth 2 (two) times daily. 09/29/22   Jannifer Rodney A, FNP  Cholecalciferol (VITAMIN D-1000 MAX ST) 1000 units tablet Take by mouth.    [provider]  Fingolimod HCl (GILENYA) 0.5 MG CAPS Take by mouth. 12/12/16 12/12/17  [provider]  ibuprofen (ADVIL,MOTRIN) 200 MG tablet Take by mouth.    [provider]  levonorgestrel (MIRENA) 20 MCG/24HR IUD 1 Intra Uterine Device (1 each total) by Intrauterine route once. 03/13/17 03/13/17  Farrel Conners, CNM  losartan (COZAAR) 25 MG tablet Take by mouth. 10/18/16 10/18/17  [provider]  OZEMPIC, 2 MG/DOSE, 8 MG/3ML SOPN Inject into the skin.    [provider]  vitamin B-12 (CYANOCOBALAMIN) 1000 MCG tablet Take by mouth.    [provider]    Physical Exam: Vitals:   10/19/22 1219 10/19/22 1519  BP: 105/71 114/72  Pulse: (!) 105 89  Resp: 16 20  Temp: 98.4 F (36.9 C)   TempSrc: Oral   SpO2: 98% 100%  Weight: 83.9 kg   Height: 5\' 7"  (1.702 m)    Heent:  anicteric Neck: no jvd Heart: rrr s1, s2, no m/g/r Lung: CTAB Abd: soft, slightly obese, nt, nd, +bs Ext: no c/c/e,  Skin: no cva tenderness Lymph: no adenopathy Neuro: nonfocal  Data Reviewed:    Assessment and Plan:  Nephrolithiasis w bilateral hydronephrosis Acute lower UTI/ pyelonephritis Pt received rocephin 1gm iv x1 in the ED Cont w rocephin 2gm iv qday  Check INR, PTT, bmp, cbc in am Urine culture pending NPO after MN Urology consulted by ED, appreciate input  Cough CXR  Hypertension controlled Cont losartan 25mg  po qhs  Anxiety Cont Lexapro 20mg  po qhs  Insomnia  Cont Melatonin 3mg  po qhs  MS, relapsing Pt on monthly injection  DVT prophylaxis: lovenox 40mg  [place] qday, SCD FULL CODE Dispo: home  Pt will be admitted observation <2 nites stay for nephrolithaisis with bilateral hydro and acute lower uti ? pyelonephritis   Advance Care Planning:   Code Status: Not on file FULL CODE  Consults: urology   Family Communication: w patient   Severity of Illness: The appropriate patient status for this patient is OBSERVATION. Observation status is judged to be reasonable and necessary in order to provide the required intensity of service to ensure the patient's safety. The patient's presenting symptoms, physical exam findings, and initial radiographic and laboratory data in the context of their medical condition is felt to place them at decreased risk for further clinical deterioration. Furthermore, it is anticipated that the patient will be medically stable for discharge from the hospital within 2 midnights of admission.   Author: , MD 10/19/2022 7:23 PM  For on call review [email or web address] .

## 2022-10-20 ENCOUNTER — Observation Stay: Payer: Medicare Other | Admitting: Certified Registered"

## 2022-10-20 ENCOUNTER — Encounter
Admission: EM | Disposition: A | Discharge status: Home / Self Care | Payer: Self-pay | Source: Home / Self Care | Attending: Emergency Medicine

## 2022-10-20 ENCOUNTER — Encounter: Payer: Self-pay | Admitting: Internal Medicine

## 2022-10-20 ENCOUNTER — Observation Stay: Payer: Medicare Other

## 2022-10-20 DIAGNOSIS — N200 Calculus of kidney: Secondary | ICD-10-CM

## 2022-10-20 DIAGNOSIS — N10 Acute tubulo-interstitial nephritis: Secondary | ICD-10-CM | POA: Diagnosis not present

## 2022-10-20 DIAGNOSIS — N1330 Unspecified hydronephrosis: Secondary | ICD-10-CM

## 2022-10-20 DIAGNOSIS — N390 Urinary tract infection, site not specified: Secondary | ICD-10-CM

## 2022-10-20 DIAGNOSIS — J101 Influenza due to other identified influenza virus with other respiratory manifestations: Secondary | ICD-10-CM | POA: Insufficient documentation

## 2022-10-20 HISTORY — PX: CYSTOSCOPY WITH STENT PLACEMENT: SHX5790

## 2022-10-20 HISTORY — PX: CYSTOSCOPY W/ RETROGRADES: SHX1426

## 2022-10-20 LAB — BASIC METABOLIC PANEL
Anion gap: 5 (ref 5–15)
BUN: 14 mg/dL (ref 6–20)
CO2: 26 mmol/L (ref 22–32)
Calcium: 8.6 mg/dL — ABNORMAL LOW (ref 8.9–10.3)
Chloride: 110 mmol/L (ref 98–111)
Creatinine, Ser: 0.81 mg/dL (ref 0.44–1.00)
GFR, Estimated: >60 mL/min (ref >60)
Glucose, Bld: 110 mg/dL — ABNORMAL HIGH (ref 70–99)
Potassium: 4.2 mmol/L (ref 3.5–5.1)
Sodium: 141 mmol/L (ref 135–145)

## 2022-10-20 LAB — CBC
HCT: 30.5 % — ABNORMAL LOW (ref 36.0–46.0)
Hemoglobin: 10.1 g/dL — ABNORMAL LOW (ref 12.0–15.0)
MCH: 29.4 pg (ref 26.0–34.0)
MCHC: 33.1 g/dL (ref 30.0–36.0)
MCV: 88.7 fL (ref 80.0–100.0)
Platelets: 241 10*3/uL (ref 150–400)
RBC: 3.44 MIL/uL — ABNORMAL LOW (ref 3.87–5.11)
RDW: 11.6 % (ref 11.5–15.5)
WBC: 9.4 10*3/uL (ref 4.0–10.5)
nRBC: 0 % (ref 0.0–0.2)

## 2022-10-20 LAB — HIV ANTIBODY (ROUTINE TESTING W REFLEX): HIV Screen 4th Generation wRfx: NONREACTIVE

## 2022-10-20 LAB — APTT: aPTT: 32 seconds (ref 24–36)

## 2022-10-20 LAB — PROTIME-INR
INR: 1.2 (ref 0.8–1.2)
Prothrombin Time: 15.1 seconds (ref 11.4–15.2)

## 2022-10-20 SURGERY — CYSTOSCOPY, WITH STENT INSERTION
Anesthesia: General | Site: Ureter | Laterality: Bilateral

## 2022-10-20 MED ORDER — FENTANYL CITRATE (PF) 100 MCG/2ML IJ SOLN
INTRAMUSCULAR | Status: AC
  Filled 2022-10-20: qty 2

## 2022-10-20 MED ORDER — MIDAZOLAM HCL 2 MG/2ML IJ SOLN
INTRAMUSCULAR | Status: AC
  Filled 2022-10-20: qty 2

## 2022-10-20 MED ORDER — PHENYLEPHRINE 80 MCG/ML (10ML) SYRINGE FOR IV PUSH (FOR BLOOD PRESSURE SUPPORT)
PREFILLED_SYRINGE | INTRAVENOUS | Status: AC
  Filled 2022-10-20: qty 10

## 2022-10-20 MED ORDER — ONDANSETRON HCL 4 MG/2ML IJ SOLN
INTRAMUSCULAR | Status: DC | PRN
  Administered 2022-10-20: 4 mg via INTRAVENOUS

## 2022-10-20 MED ORDER — DEXAMETHASONE SODIUM PHOSPHATE 10 MG/ML IJ SOLN
INTRAMUSCULAR | Status: DC | PRN
  Administered 2022-10-20: 10 mg via INTRAVENOUS

## 2022-10-20 MED ORDER — ONDANSETRON HCL 4 MG/2ML IJ SOLN
INTRAMUSCULAR | Status: AC
  Filled 2022-10-20: qty 2

## 2022-10-20 MED ORDER — OXYBUTYNIN CHLORIDE 5 MG PO TABS
5.0000 mg | ORAL_TABLET | Freq: Three times a day (TID) | ORAL | Status: DC | PRN
  Administered 2022-10-20 – 2022-10-21 (×3): 5 mg via ORAL
  Filled 2022-10-20 (×2): qty 1

## 2022-10-20 MED ORDER — MORPHINE SULFATE (PF) 2 MG/ML IV SOLN: 1.0000 mg | INTRAVENOUS | Status: DC | PRN

## 2022-10-20 MED ORDER — MIDAZOLAM HCL 2 MG/2ML IJ SOLN
INTRAMUSCULAR | Status: DC | PRN
  Administered 2022-10-20: 2 mg via INTRAVENOUS

## 2022-10-20 MED ORDER — ONDANSETRON HCL 4 MG/2ML IJ SOLN: 4.0000 mg | Freq: Once | INTRAMUSCULAR | Status: DC | PRN

## 2022-10-20 MED ORDER — FENTANYL CITRATE (PF) 100 MCG/2ML IJ SOLN: 25.0000 ug | INTRAMUSCULAR | Status: DC | PRN

## 2022-10-20 MED ORDER — IPRATROPIUM-ALBUTEROL 0.5-2.5 (3) MG/3ML IN SOLN
RESPIRATORY_TRACT | Status: AC
  Administered 2022-10-20: 3 mL via RESPIRATORY_TRACT
  Filled 2022-10-20: qty 3

## 2022-10-20 MED ORDER — OXYCODONE HCL 5 MG/5ML PO SOLN: 5.0000 mg | Freq: Once | ORAL | Status: AC | PRN

## 2022-10-20 MED ORDER — LACTATED RINGERS IV SOLN: INTRAVENOUS | Status: DC | PRN

## 2022-10-20 MED ORDER — OXYCODONE HCL 5 MG PO TABS
5.0000 mg | ORAL_TABLET | Freq: Once | ORAL | Status: AC | PRN
  Administered 2022-10-20: 5 mg via ORAL

## 2022-10-20 MED ORDER — PHENYLEPHRINE 80 MCG/ML (10ML) SYRINGE FOR IV PUSH (FOR BLOOD PRESSURE SUPPORT): PREFILLED_SYRINGE | INTRAVENOUS | Status: DC | PRN

## 2022-10-20 MED ORDER — ONDANSETRON HCL 4 MG/2ML IJ SOLN: 4.0000 mg | Freq: Four times a day (QID) | INTRAMUSCULAR | Status: DC | PRN

## 2022-10-20 MED ORDER — LOSARTAN POTASSIUM 25 MG PO TABS
25.0000 mg | ORAL_TABLET | Freq: Every day | ORAL | Status: DC
  Administered 2022-10-21: 25 mg via ORAL
  Filled 2022-10-20: qty 1

## 2022-10-20 MED ORDER — ACETAMINOPHEN 325 MG PO TABS
650.0000 mg | ORAL_TABLET | Freq: Four times a day (QID) | ORAL | Status: DC | PRN
  Administered 2022-10-20 – 2022-10-21 (×2): 650 mg via ORAL
  Filled 2022-10-20 (×2): qty 2

## 2022-10-20 MED ORDER — LACTATED RINGERS IV SOLN: INTRAVENOUS | Status: DC

## 2022-10-20 MED ORDER — SODIUM CHLORIDE 0.9 % IV SOLN: INTRAVENOUS | Status: AC

## 2022-10-20 MED ORDER — PROPOFOL 10 MG/ML IV BOLUS
INTRAVENOUS | Status: AC
  Filled 2022-10-20: qty 20

## 2022-10-20 MED ORDER — SODIUM CHLORIDE 0.9 % IR SOLN
Status: DC | PRN
  Administered 2022-10-20: 3000 mL

## 2022-10-20 MED ORDER — BISACODYL 10 MG RE SUPP
10.0000 mg | Freq: Every day | RECTAL | Status: DC | PRN
  Administered 2022-10-20: 10 mg via RECTAL
  Filled 2022-10-20: qty 1

## 2022-10-20 MED ORDER — SENNOSIDES-DOCUSATE SODIUM 8.6-50 MG PO TABS
2.0000 | ORAL_TABLET | Freq: Two times a day (BID) | ORAL | Status: DC
  Administered 2022-10-20 – 2022-10-21 (×3): 2 via ORAL
  Filled 2022-10-20 (×3): qty 2

## 2022-10-20 MED ORDER — IPRATROPIUM-ALBUTEROL 0.5-2.5 (3) MG/3ML IN SOLN: 3.0000 mL | Freq: Once | RESPIRATORY_TRACT | Status: AC | PRN

## 2022-10-20 MED ORDER — ROCURONIUM BROMIDE 100 MG/10ML IV SOLN
INTRAVENOUS | Status: DC | PRN
  Administered 2022-10-20: 40 mg via INTRAVENOUS

## 2022-10-20 MED ORDER — OXYCODONE HCL 5 MG PO TABS
ORAL_TABLET | ORAL | Status: AC
  Filled 2022-10-20: qty 1

## 2022-10-20 MED ORDER — PROPOFOL 10 MG/ML IV BOLUS
INTRAVENOUS | Status: DC | PRN
  Administered 2022-10-20: 150 mg via INTRAVENOUS

## 2022-10-20 MED ORDER — OXYBUTYNIN CHLORIDE 5 MG PO TABS
ORAL_TABLET | ORAL | Status: AC
  Filled 2022-10-20: qty 1

## 2022-10-20 MED ORDER — TAMSULOSIN HCL 0.4 MG PO CAPS
0.4000 mg | ORAL_CAPSULE | Freq: Every day | ORAL | Status: DC
  Administered 2022-10-20 – 2022-10-21 (×2): 0.4 mg via ORAL
  Filled 2022-10-20 (×2): qty 1

## 2022-10-20 MED ORDER — PHENYLEPHRINE HCL (PRESSORS) 10 MG/ML IV SOLN
INTRAVENOUS | Status: DC | PRN
  Administered 2022-10-20: 100 ug via INTRAVENOUS
  Administered 2022-10-20: 50 ug via INTRAVENOUS
  Administered 2022-10-20 (×2): 100 ug via INTRAVENOUS

## 2022-10-20 MED ORDER — FENTANYL CITRATE (PF) 100 MCG/2ML IJ SOLN
INTRAMUSCULAR | Status: DC | PRN
  Administered 2022-10-20 (×2): 50 ug via INTRAVENOUS

## 2022-10-20 MED ORDER — IOHEXOL 180 MG/ML  SOLN
INTRAMUSCULAR | Status: DC | PRN
  Administered 2022-10-20: 20 mL

## 2022-10-20 MED ORDER — ACETAMINOPHEN 10 MG/ML IV SOLN: 1000.0000 mg | Freq: Once | INTRAVENOUS | Status: DC | PRN

## 2022-10-20 MED ORDER — SUGAMMADEX SODIUM 200 MG/2ML IV SOLN
INTRAVENOUS | Status: DC | PRN
  Administered 2022-10-20: 200 mg via INTRAVENOUS

## 2022-10-20 MED ORDER — OXYCODONE HCL 5 MG PO TABS
5.0000 mg | ORAL_TABLET | ORAL | Status: DC | PRN
  Administered 2022-10-20 – 2022-10-22 (×10): 5 mg via ORAL
  Filled 2022-10-20 (×10): qty 1

## 2022-10-20 MED ORDER — DEXAMETHASONE SODIUM PHOSPHATE 10 MG/ML IJ SOLN
INTRAMUSCULAR | Status: AC
  Filled 2022-10-20: qty 1

## 2022-10-20 MED ORDER — LIDOCAINE HCL (CARDIAC) PF 100 MG/5ML IV SOSY
PREFILLED_SYRINGE | INTRAVENOUS | Status: DC | PRN
  Administered 2022-10-20: 80 mg via INTRAVENOUS

## 2022-10-20 MED ORDER — ENOXAPARIN SODIUM 40 MG/0.4ML IJ SOSY
40.0000 mg | PREFILLED_SYRINGE | INTRAMUSCULAR | Status: DC
  Administered 2022-10-20 – 2022-10-21 (×2): 40 mg via SUBCUTANEOUS
  Filled 2022-10-20: qty 0.4

## 2022-10-20 MED ORDER — ACETAMINOPHEN 650 MG RE SUPP: 650.0000 mg | Freq: Four times a day (QID) | RECTAL | Status: DC | PRN

## 2022-10-20 SURGICAL SUPPLY — 21 items
BAG DRAIN SIEMENS DORNER NS (MISCELLANEOUS) ×2 IMPLANT
BAG DRN NS LF (MISCELLANEOUS) ×1
BRUSH SCRUB EZ 1% IODOPHOR (MISCELLANEOUS) ×2 IMPLANT
CATH URETL OPEN 5X70 (CATHETERS) IMPLANT
GAUZE 4X4 16PLY ~~LOC~~+RFID DBL (SPONGE) ×4 IMPLANT
GLOVE SURG UNDER POLY LF SZ7.5 (GLOVE) ×2 IMPLANT
GOWN STRL REUS W/ TWL XL LVL3 (GOWN DISPOSABLE) ×2 IMPLANT
GOWN STRL REUS W/TWL XL LVL3 (GOWN DISPOSABLE) ×1
GUIDEWIRE STR DUAL SENSOR (WIRE) ×2 IMPLANT
IV NS IRRIG 3000ML ARTHROMATIC (IV SOLUTION) ×2 IMPLANT
KIT TURNOVER CYSTO (KITS) ×2 IMPLANT
MANIFOLD NEPTUNE II (INSTRUMENTS) ×2 IMPLANT
PACK CYSTO AR (MISCELLANEOUS) ×2 IMPLANT
SET CYSTO W/LG BORE CLAMP LF (SET/KITS/TRAYS/PACK) ×2 IMPLANT
STENT URET 6FRX24 CONTOUR (STENTS) IMPLANT
STENT URET 6FRX26 CONTOUR (STENTS) IMPLANT
STENT URET 6FRX28 CONTOUR (STENTS) IMPLANT
SURGILUBE 2OZ TUBE FLIPTOP (MISCELLANEOUS) ×2 IMPLANT
TRAP FLUID SMOKE EVACUATOR (MISCELLANEOUS) ×2 IMPLANT
WATER STERILE IRR 1000ML POUR (IV SOLUTION) ×2 IMPLANT
WATER STERILE IRR 500ML POUR (IV SOLUTION) ×2 IMPLANT

## 2022-10-20 NOTE — ED Notes (Signed)
Report given to [place], RN  

## 2022-10-20 NOTE — Hospital Course (Signed)
Robin Diaz is a 41 y.o. female with medical history significant of hypertension, Multiple sclerosis w recent UTI x2, who presents to the hospital with fever and right flank pain.  CT scan showed bilateral kidney stone with hydronephrosis, patient was brought to the OR by urology on 12/24, bilateral ureteral stent placed. Patient currently on IV antibiotics. Initial culture came back with E. coli in the urine, urine culture from surgery alcohol negative.  Patient is clinically improved, will continue 7 days of oral Keflex.

## 2022-10-20 NOTE — Anesthesia Procedure Notes (Signed)
Procedure Name: Intubation Date/Time: 10/20/2022 9:56 AM  Performed by: Garner Nash, CRNAPre-anesthesia Checklist: Patient identified, Emergency Drugs available, Suction available and Patient being monitored Patient Re-evaluated:Patient Re-evaluated prior to induction Oxygen Delivery Method: Circle system utilized Preoxygenation: Pre-oxygenation with 100% oxygen Induction Type: IV induction Ventilation: Mask ventilation without difficulty Laryngoscope Size: Mac and 3 Grade View: Grade II Tube type: Oral Tube size: 6.5 mm Number of attempts: 1 Placement Confirmation: ETT inserted through vocal cords under direct vision, positive ETCO2 and breath sounds checked- equal and bilateral Secured at: 19 cm Tube secured with: Tape Dental Injury: Teeth and Oropharynx as per pre-operative assessment

## 2022-10-20 NOTE — Transfer of Care (Signed)
Immediate Anesthesia Transfer of Care Note  Patient: Robin Diaz  Procedure(s) Performed: CYSTOSCOPY WITH BILATERAL  STENT PLACEMENT (Bilateral: Ureter) CYSTOSCOPY WITH RETROGRADE PYELOGRAM (Bilateral: Ureter)  Patient Location: PACU  Anesthesia Type:General  Level of Consciousness: awake  Airway & Oxygen Therapy: Patient Spontanous Breathing  Post-op Assessment: Report given to RN  Post vital signs: stable  Last Vitals:  Vitals Value Taken Time  BP 110/46 10/20/22 1037  Temp    Pulse 100 10/20/22 1042  Resp 16 10/20/22 1042  SpO2 99 % 10/20/22 1042  Vitals shown include unvalidated device data.  Last Pain:  Vitals:   10/20/22 0800  TempSrc: Oral  PainSc:          Complications: No notable events documented.

## 2022-10-20 NOTE — Progress Notes (Signed)
  Progress Note   Patient: Robin Diaz TDS:[id number] DOB: 06/05/1981 DOA: 10/19/2022     0 DOS: the patient was seen and examined on 10/20/2022   Brief hospital course: DAJA SHUPING is a 41 y.o. female with medical history significant of hypertension, Multiple sclerosis w recent UTI x2, who presents to the hospital with fever and right flank pain.  CT scan showed bilateral kidney stone with hydronephrosis, patient was brought to the OR by urology on 12/24, bilateral ureteral stent placed. Culture pending.  Patient currently on IV antibiotics.  Assessment and Plan: Bilateral nephrolithiasis with hydronephrosis. Acute pyelonephritis. Status post bilateral ureteral stent.  Condition improving, no additional fever.  Continue Rocephin, pending culture results.  Influenza A. Patient no longer has any short of breath, still have a cough.  Symptoms started a week ago.  Multiple sclerosis. Continue home medicines schedule of monthly injection.     Subjective:  Patient feel much better today, no additional fever.  Physical Exam: Vitals:   10/20/22 1215 10/20/22 1230 10/20/22 1245 10/20/22 1327  BP:   100/69 108/60  Pulse: 73  84 72  Resp: 14  17 18   Temp:   (!) 97.3 F (36.3 C) 98.6 F (37 C)  TempSrc:      SpO2: 93% 90% 93% 96%  Weight:      Height:       General exam: Appears calm and comfortable  Respiratory system: Clear to auscultation. Respiratory effort normal. Cardiovascular system: S1 & S2 heard, RRR. No JVD, murmurs, rubs, gallops or clicks. No pedal edema. Gastrointestinal system: Abdomen is nondistended, soft and nontender. No organomegaly or masses felt. Normal bowel sounds heard. Central nervous system: Alert and oriented. No focal neurological deficits. Extremities: Symmetric 5 x 5 power. Skin: No rashes, lesions or ulcers Psychiatry: Judgement and insight appear normal. Mood & affect appropriate.   Data Reviewed:  Lab results reviewed.  Family  Communication:   Disposition: Status is: Observation The patient will require care spanning > 2 midnights and should be moved to inpatient because: Severity of disease, IV antibiotics.  Planned Discharge Destination: Home    Time spent: 35 minutes  Author: , MD 10/20/2022 3:13 PM  For on call review [email or web address].

## 2022-10-20 NOTE — Op Note (Signed)
   Preoperative diagnosis:  Bilateral renal pelvic calculi with obstruction Urinary tract infection  Postoperative diagnosis:  Same  Procedure:  Cystoscopy Bilateral ureteral stent placement (45F/28 cm)  Bilateral retrograde pyelography with interpretation   Surgeon: Lorin Picket C. Stina Gane, M.D.  Anesthesia: General  Complications: None  Intraoperative findings:  Cystoscopy-cloudy urine.  Mild mucosal erythema.  No solid or papillary lesions Right retrograde pyelogram: ~ 2 cm calculus right renal pelvis upper pole hydrocalyx; mild calyceal dilation mid/lower Left retrograde pyelogram: ~ 2 cm calculus renal pelvis.  Less prominent upper pole hydrocalyx.  Mild calyceal dilation mid to lower  EBL: Minimal  Specimens: Urine from left and right renal pelvis sent for culture  Indication: Robin Diaz is a 41 y.o. patient with bilateral 2 cm renal pelvic calculi with hydronephrosis.  Intermittent UTI symptoms since early December resolving with antibiotics then returning.  UA on admission >50 RBC/WBC.  Temp to 102.7 last night.  Creatinine 0.89.  After reviewing the management options for treatment, he elected to proceed with the above surgical procedure(s). We have discussed the potential benefits and risks of the procedure, side effects of the proposed treatment, the likelihood of the patient achieving the goals of the procedure, and any potential problems that might occur during the procedure or recuperation. Informed consent has been obtained.  Description of procedure:  The patient was taken to the operating room and general anesthesia was induced.  The patient was placed in the dorsal lithotomy position, prepped and draped in the usual sterile fashion, and preoperative antibiotics were administered. A preoperative time-out was performed.   A 21 French scope was lubricated and passed per urethra.  Panendoscopy was performed with findings as described above.  Attention then turned to  the right ureteral orifice and a 0.038 Sensor guidewire was then advanced up the ureter into the renal pelvis and advanced superior to the calculus under fluoroscopic guidance.  A 5 French open-ended ureteral catheter was placed over the guidewire.  The guidewire was removed and cloudy urine was aspirated and sent for culture.  Retrograde pyelogram was then performed as described above.  The guidewire was placed and the ureteral catheter was removed.  A 45F/24 cm Contour ureteral stent was advanced over the guidewire however only the tip was present above the stone.  The 24 cm stent was removed and replaced with a 45F/28 cm Contour ureteral stent with a good curl noted in the upper pole calyx.  Attention was directed to the left UO where an identical procedure was performed.  Urine from the left kidney was also cloudy and sent for culture.  Retrograde pyelogram was performed as described above.  A 45F/28 cm contour ureteral stent was placed on the left with adequate positioning noted..  The bladder was then emptied and the procedure ended.  The patient appeared to tolerate the procedure well and without complications.  After anesthetic reversal she was transported to the PACU in stable condition.   [name], MD

## 2022-10-20 NOTE — Discharge Instructions (Signed)
DISCHARGE INSTRUCTIONS FOR URETERAL STENT   MEDICATIONS:  1. Resume all your other meds from home.  2.  AZO (over-the-counter) can help with the burning/stinging when you urinate. 3.  Tamsulosin and oxybutynin are for bladder irritation.  Prescriptions will be sent at discharge  ACTIVITY:  1. May resume regular activities in 24 hours. 2. No driving while on narcotic pain medications  3. Drink plenty of water  4. Continue to walk at home - you can still get blood clots when you are at home, so keep active, but don't over do it.  5. May return to work/school tomorrow or when you feel ready   BATHING:  1. You can shower.   SIGNS/SYMPTOMS TO CALL:  Common postoperative symptoms include urinary frequency, urgency, bladder spasm and blood in the urine  Please call us if you have a fever greater than 101.5, uncontrolled nausea/vomiting, uncontrolled pain, dizziness, unable to urinate, excessively bloody urine, chest pain, shortness of breath, leg swelling, leg pain, or any other concerns or questions.   You can reach [place] at [phone].   FOLLOW-UP:  1. You will be contacted by [facility] regarding follow-up

## 2022-10-20 NOTE — Anesthesia Postprocedure Evaluation (Signed)
Anesthesia Post Note  Patient: Robin Diaz  Procedure(s) Performed: CYSTOSCOPY WITH BILATERAL  STENT PLACEMENT (Bilateral: Ureter) CYSTOSCOPY WITH RETROGRADE PYELOGRAM (Bilateral: Ureter)  Patient location during evaluation: PACU Anesthesia Type: General Level of consciousness: awake and alert, oriented and patient cooperative Pain management: pain level controlled Vital Signs Assessment: post-procedure vital signs reviewed and stable Respiratory status: spontaneous breathing, nonlabored ventilation and respiratory function stable Cardiovascular status: blood pressure returned to baseline and stable Postop Assessment: adequate PO intake Anesthetic complications: no   No notable events documented.   Last Vitals:  Vitals:   10/20/22 0800 10/20/22 1037  BP: 114/79 (!) 110/46  Pulse: 71 94  Resp: 18 12  Temp: 36.9 C (!) 36.3 C  SpO2: 96% 98%    Last Pain:  Vitals:   10/20/22 0800  TempSrc: Oral  PainSc:                  Reed Breech

## 2022-10-20 NOTE — Progress Notes (Signed)
Patient awake/alert x4.  Ambulated to BR without event, voided yellow urine. Tolerating crackers and po fluids witihout event. Questioning whether she could go home.

## 2022-10-20 NOTE — ED Notes (Addendum)
[place], RN with OR called and report given. Patient placed in nothing but gown. Belongings with patient

## 2022-10-20 NOTE — Progress Notes (Signed)
Mobility Specialist - Progress Note   10/20/22 1405  Mobility  Activity Ambulated independently in room;Ambulated independently to bathroom  Level of Assistance Independent  Assistive Device None  Distance Ambulated (ft) 10 ft  Activity Response Tolerated well  Mobility Referral Yes  $Mobility charge 1 Mobility   Pt supine in bed on RA upon arrival. Pt STS and ambulates to/from bathroom indep. Pt returns to supine in bed with needs in reach.   Terrilyn Saver  Mobility Specialist  10/20/22 2:27 PM

## 2022-10-20 NOTE — Anesthesia Preprocedure Evaluation (Addendum)
Anesthesia Evaluation  Patient identified by MRN, date of birth, ID band Patient awake    Reviewed: Allergy & Precautions, NPO status , Patient's Chart, lab work & pertinent test results  History of Anesthesia Complications Negative for: history of anesthetic complications  Airway Mallampati: IV   Neck ROM: Full    Dental no notable dental hx.    Pulmonary neg pulmonary ROS   Pulmonary exam normal breath sounds clear to auscultation       Cardiovascular hypertension, Normal cardiovascular exam Rhythm:Regular Rate:Normal     Neuro/Psych  Neuromuscular disease (multiple sclerosis)    GI/Hepatic negative GI ROS,,,  Endo/Other  negative endocrine ROS    Renal/GU      Musculoskeletal   Abdominal   Peds  Hematology negative hematology ROS (+)   Anesthesia Other Findings Last dose of Ozempic approx 3-4 weeks ago.  Reproductive/Obstetrics                             Anesthesia Physical Anesthesia Plan  ASA: 2 and emergent  Anesthesia Plan: General   Post-op Pain Management:    Induction: Intravenous  PONV Risk Score and Plan: 3 and Ondansetron, Dexamethasone and Treatment may vary due to age or medical condition  Airway Management Planned: Oral ETT  Additional Equipment:   Intra-op Plan:   Post-operative Plan: Extubation in OR  Informed Consent: I have reviewed the patients History and Physical, chart, labs and discussed the procedure including the risks, benefits and alternatives for the proposed anesthesia with the patient or authorized representative who has indicated his/her understanding and acceptance.     Dental advisory given  Plan Discussed with: CRNA  Anesthesia Plan Comments: (Anesthetic considerations for multiple sclerosis: closely monitor body temperature to avoid increase above baseline, avoid succinylcholine, pt may have altered sensitivity to NDMRs.   Patient  consented for risks of anesthesia including but not limited to:  - adverse reactions to medications - damage to eyes, teeth, lips or other oral mucosa - nerve damage due to positioning  - sore throat or hoarseness - damage to heart, brain, nerves, lungs, other parts of body or loss of life  Informed patient about role of CRNA in peri- and intra-operative care.  Patient voiced understanding.)       Anesthesia Quick Evaluation

## 2022-10-20 NOTE — Consult Note (Addendum)
Urology Consult  Chief Complaint: UTI  History of Present Illness: Robin Diaz is a 41 y.o. complaining of recurrent UTI since 09/29/2022.  She had a virtual visit at that time with a 1 week history of dysuria, frequency, urgency and urinary hesitancy.  She was treated with a 7-day course of Keflex twice daily with improvement in her symptoms.  She had a second telephone visit with PCP 10/11/2022 with recurrent dysuria urgency and was treated with a 7-day course of Macrobid.  Has not had urinalysis or urine culture.  Presented to [facility] ED yesterday afternoon with complaints of recurrent dysuria.  She has had intermittent fever and flank pain.  Urinalysis was nitrite positive with >50 WBCs/RBCs.  There were 11-20 squamous epithelial cells.  Stone protocol CT was performed which showed bilateral renal pelvic calculi measuring >2 cm in size along with bilateral lower pole calculi.  Moderate hydronephrosis R >L felt secondary to the calculi.  Her creatinine was normal at 0.89.  Temp spike to 102.7 last night  After discussing with the ED physician she was admitted to the hospitalist service last night for IV antibiotics and recommended bilateral stent placement today.  She has had a nonproductive cough.  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   Cholecalciferol (VITAMIN D-1000 MAX ST) 1000 units tablet Take by mouth.    LEXAPRO 20 MG tablet Take 20 mg by mouth daily.   losartan (COZAAR) 50 MG tablet Take 50 mg by mouth daily.   melatonin 3 MG TABS tablet Take 3 mg by mouth at bedtime.   Ofatumumab 20 MG/0.4ML SOAJ Inject 20 mg into the skin every 28 (twenty-eight) days.   vitamin B-12 (CYANOCOBALAMIN) 1000 MCG tablet Take by mouth.    Allergies:  Allergies  Allergen Reactions   Gadobenate Swelling, Hives and Itching    Lower lip swelling.  11/2 - BREAKTHROUGH RXN - Hives/Itching on Rt side - treated with PO Benadryl. Darl Pikes RN   Lower lip swelling.   Iodinated Contrast Media     Other Reaction(s): Other (See Comments)  Lip and tongue swelling    Family History  Problem Relation Age of Onset   Hypertension Father    Colon cancer Maternal Uncle 63   Breast cancer Paternal Grandmother 63       second   Colon cancer Paternal Grandmother 77       first   Lung cancer Paternal Grandmother        third   Breast cancer Cousin 2       paternal cousin    Social History:  reports that she has never smoked. She has never used smokeless tobacco. She reports that she does not drink alcohol and does not use drugs.  ROS: A complete review of systems was performed.  All systems are negative except for pertinent  findings as noted.  Physical Exam:  Vital signs in last 24 hours: Temp:  [98.3 F (36.8 C)-102.7 F (39.3 C)] 98.5 F (36.9 C) (12/24 0800) Pulse Rate:  [68-105] 71 (12/24 0800) Resp:  [16-20] 18 (12/24 0800) BP: (86-125)/(52-79) 114/79 (12/24 0800) SpO2:  [93 %-100 %] 96 % (12/24 0800) Weight:  [83.9 kg] 83.9 kg (12/23 1219) Constitutional:  Alert and oriented, No acute distress HEENT: [place] AT, moist mucus membranes.  Trachea midline, no masses Cardiovascular: Regular rate and rhythm, no clubbing, cyanosis, or edema. Respiratory: Normal respiratory effort, lungs clear bilaterally GI: Abdomen is soft, nontender, nondistended, no abdominal masses GU: No CVA tenderness Skin: No rashes,  bruises or suspicious lesions Lymph: No cervical or inguinal adenopathy Neurologic: Grossly intact, no focal deficits, moving all 4 extremities Psychiatric: Normal mood and affect   Laboratory Data:  Recent Labs    10/19/22 1644 10/20/22 0517  WBC 10.2 9.4  HGB 12.5 10.1*  HCT 37.9 30.5*   Recent Labs    10/19/22 1644 10/20/22 0517  NA 141 141  K 4.5 4.2  CL 107 110  CO2 25 26  GLUCOSE 100* 110*  BUN 14 14  CREATININE 0.89 0.81  CALCIUM 9.3 8.6*   Recent Labs    10/20/22 0517  INR 1.2   No results for input(s): "LABURIN" in the last 72 hours. Results for orders placed or performed during the hospital encounter of 10/19/22  Resp panel by RT-PCR (RSV, Flu A&B, Covid) Anterior Nasal Swab     Status: Abnormal   Collection Time: 10/19/22  7:00 PM   Specimen: Anterior Nasal Swab  Result Value Ref Range Status   SARS Coronavirus 2 by RT PCR NEGATIVE NEGATIVE Final    Comment: (NOTE) SARS-CoV-2 target nucleic acids are NOT DETECTED.  The SARS-CoV-2 RNA is generally detectable in upper respiratory specimens during the acute phase of infection. The lowest concentration of SARS-CoV-2 viral copies this assay can detect is 138 copies/mL. A negative result does not preclude SARS-Cov-2 infection and should not be used as the sole basis for treatment or other patient management decisions. A negative result may occur with  improper specimen collection/handling, submission of specimen other than nasopharyngeal swab, presence of viral mutation(s) within the areas targeted by this assay, and inadequate number of viral copies(<138 copies/mL). A negative result must be combined with clinical observations, patient history, and epidemiological information. The expected result is Negative.  Fact Sheet for Patients:  BloggerCourse.com  Fact Sheet for Healthcare Providers:  SeriousBroker.it  This test is no t yet approved or cleared  by the [place] FDA and  has been authorized for detection and/or diagnosis of SARS-CoV-2 by FDA under an Emergency Use Authorization (EUA). This EUA will remain  in effect (meaning this test can be used) for the duration of the COVID-19 declaration under Section 564(b)(1) of the Act, 21 U.S.C.section 360bbb-3(b)(1), unless the authorization is terminated  or revoked sooner.       Influenza A by PCR POSITIVE (A) NEGATIVE Final   Influenza B by PCR NEGATIVE NEGATIVE Final    Comment: (NOTE) The Xpert Xpress SARS-CoV-2/FLU/RSV plus assay is intended as an aid in the diagnosis of influenza from Nasopharyngeal swab specimens and should not be used as a sole basis for treatment. Nasal washings and aspirates are unacceptable for Xpert Xpress SARS-CoV-2/FLU/RSV testing.  Fact Sheet for Patients: BloggerCourse.com  Fact Sheet for Healthcare Providers: SeriousBroker.it  This test is not yet approved or cleared by the [place]  FDA and has been authorized for detection and/or diagnosis of SARS-CoV-2 by FDA under an Emergency Use Authorization (EUA). This EUA will remain in effect (meaning this test can be used) for the duration of the COVID-19 declaration under Section 564(b)(1) of the Act, 21 U.S.C. section 360bbb-3(b)(1), unless the authorization is terminated or revoked.     Resp Syncytial Virus by PCR NEGATIVE NEGATIVE Final    Comment: (NOTE) Fact Sheet for Patients: BloggerCourse.com  Fact Sheet for Healthcare Providers: SeriousBroker.it  This test is not yet approved or cleared by the [place] FDA and has been authorized for detection and/or diagnosis of SARS-CoV-2 by FDA under an Emergency Use Authorization (EUA). This EUA will remain in effect (meaning this test can be used) for the duration of the COVID-19 declaration under Section 564(b)(1) of the Act, 21  U.S.C. section 360bbb-3(b)(1), unless the authorization is terminated or revoked.  Performed at [facility] [address], [place], [place] [address]      Radiologic Imaging: CT images were personally reviewed and interpreted  DG OR UROLOGY CYSTO IMAGE [facility] ONLY)  Result Date: 10/20/2022 There is no interpretation for this exam.  This order is for images obtained during a surgical procedure.  Please See "Surgeries" Tab for more information regarding the procedure.   DG Chest 2 View  Result Date: 10/19/2022 CLINICAL DATA:  Cough EXAM: CHEST - 2 VIEW COMPARISON:  05/09/2020 FINDINGS: Stable cardiomediastinal silhouette. Both lungs are clear. The visualized skeletal structures are unremarkable. IMPRESSION: No active cardiopulmonary disease. Electronically Signed   By: Minerva Fester M.D.   On: 10/19/2022 21:09   CT Renal Stone Study  Result Date: 10/19/2022 CLINICAL DATA:  Abdominal pain. Flank pain. Foul smelling urine. Bladder region pain. EXAM: CT ABDOMEN AND PELVIS WITHOUT CONTRAST TECHNIQUE: Multidetector CT imaging of the abdomen and pelvis was performed following the standard protocol without IV contrast. RADIATION DOSE REDUCTION: This exam was performed according to the departmental dose-optimization program which includes automated exposure control, adjustment of the mA and/or kV according to patient size and/or use of iterative reconstruction technique. COMPARISON:  10/05/2012. FINDINGS: Lower chest: Clear lung bases. Hepatobiliary: No focal liver abnormality is seen. No gallstones, gallbladder wall thickening, or biliary dilatation. Pancreas: Unremarkable. No pancreatic ductal dilatation or surrounding inflammatory changes. Spleen: Normal in size without focal abnormality. Adrenals/Urinary Tract: Normal adrenal glands. Moderate, right greater than left, hydronephrosis. Large stones lie in both renal pelvises, crossing the ureteropelvic junctions, both 2.2 cm in size.  Several small stones noted in the lower poles of both kidneys. Mild bilateral perinephric stranding, adjacent to intrarenal collecting systems and along the inferior aspect of the right kidney. No abscess. No renal mass. Ureters are normal in course and in caliber. No ureteral stones. Bladder minimally distended. No convincing wall thickening, stone or mass. Stomach/Bowel: Normal stomach. Small bowel and colon are normal in caliber. No wall thickening. No inflammation. No evidence of appendicitis. Vascular/Lymphatic: No significant vascular findings are present. No enlarged abdominal or pelvic lymph nodes. Reproductive: Well-positioned IUD. Uterus otherwise unremarkable. No adnexal masses. Other: No abdominal wall hernia or abnormality. No abdominopelvic ascites. Musculoskeletal: No fracture or acute finding.  No bone lesion. IMPRESSION: 1. Moderate, right greater than left, bilateral hydronephrosis. This appears due to to large bilateral renal pelvis stones causing ball valve type obstructions at the ureteropelvic junctions. No other acute abnormality. 2. No ureteral stone or dilation. 3. Additional small nonobstructing stones in both kidneys. Electronically Signed   By: Renard Hamper.D.  On: 10/19/2022 13:05    Impression/Assessment:  41 y.o. female with large, bilateral renal pelvic calculi and bilateral hydronephrosis UA with pyuria.  Urine culture pending Temp to 102.7 last night   Recommendations:  Cystoscopy with bilateral ureteral stent placement.  Urine will be obtained proximal to the calculi and sent for culture The procedure was discussed in detail including potential risks of bleeding, sepsis.  The likelihood of stent related symptoms were also discussed We discussed due to infection no attempt will be made at stone removal or treatment and she will need follow-up procedure.  Based on stone size the most successful option would be PCNL.  Staged ureteroscopy was also discussed. All  questions were answered and she desires to proceed.   10/20/2022, 8:43 AM  [name],  MD

## 2022-10-21 ENCOUNTER — Encounter: Payer: Self-pay | Admitting: Urology

## 2022-10-21 DIAGNOSIS — G35 Multiple sclerosis: Secondary | ICD-10-CM | POA: Diagnosis not present

## 2022-10-21 DIAGNOSIS — N200 Calculus of kidney: Secondary | ICD-10-CM | POA: Diagnosis not present

## 2022-10-21 DIAGNOSIS — J101 Influenza due to other identified influenza virus with other respiratory manifestations: Secondary | ICD-10-CM

## 2022-10-21 DIAGNOSIS — N10 Acute tubulo-interstitial nephritis: Secondary | ICD-10-CM | POA: Diagnosis not present

## 2022-10-21 LAB — URINE CULTURE
Culture: NO GROWTH
Culture: NO GROWTH

## 2022-10-21 MED ORDER — OXYBUTYNIN CHLORIDE 5 MG PO TABS
5.0000 mg | ORAL_TABLET | Freq: Four times a day (QID) | ORAL | Status: DC | PRN
  Administered 2022-10-21 – 2022-10-22 (×4): 5 mg via ORAL
  Filled 2022-10-21 (×4): qty 1

## 2022-10-21 NOTE — Progress Notes (Signed)
  Progress Note   Patient: Robin Diaz:[id number] DOB: 25-Oct-1981 DOA: 10/19/2022     0 DOS: the patient was seen and examined on 10/21/2022   Brief hospital course: Robin Diaz is a 41 y.o. female with medical history significant of hypertension, Multiple sclerosis w recent UTI x2, who presents to the hospital with fever and right flank pain.  CT scan showed bilateral kidney stone with hydronephrosis, patient was brought to the OR by urology on 12/24, bilateral ureteral stent placed. Culture pending.  Patient currently on IV antibiotics.  Assessment and Plan: Bilateral nephrolithiasis with hydronephrosis. Acute pyelonephritis. Initial urine culture grew gram-negative rods, culture from surgery still pending.  Patient still has significant spasms in the bladder, continue Flomax and a potential.  Continue current antibiotics with Rocephin until culture results available.   Influenza A. Patient no longer has any short of breath, still have a cough.  Symptoms started a week ago.   Multiple sclerosis. Continue home medicines schedule of monthly injection.        Subjective:  Patient still complaining of bladder spasm.  No fever or chills.  Physical Exam: Vitals:   10/20/22 1631 10/20/22 2026 10/21/22 0639 10/21/22 0829  BP: 115/72 127/71 125/72 125/78  Pulse: 60 72 (!) 58 (!) 56  Resp: 18 18 16 20   Temp: 97.8 F (36.6 C) 98 F (36.7 C) 97.7 F (36.5 C) 97.7 F (36.5 C)  TempSrc: Oral Oral Oral Oral  SpO2: 98% 99% 96% 98%  Weight:      Height:       General exam: Appears calm and comfortable  Respiratory system: Clear to auscultation. Respiratory effort normal. Cardiovascular system: S1 & S2 heard, RRR. No JVD, murmurs, rubs, gallops or clicks. No pedal edema. Gastrointestinal system: Abdomen is nondistended, soft and nontender. No organomegaly or masses felt. Normal bowel sounds heard. Central nervous system: Alert and oriented. No focal neurological  deficits. Extremities: Symmetric 5 x 5 power. Skin: No rashes, lesions or ulcers Psychiatry: Judgement and insight appear normal. Mood & affect appropriate.   Data Reviewed:  Lab results reviewed.  Family Communication: None  Disposition: Status is: Observation   Planned Discharge Destination: Home    Time spent: 35 minutes  Author: , MD 10/21/2022 10:12 AM  For on call review www.[email or web address]/23/2022.

## 2022-10-21 NOTE — Plan of Care (Signed)

## 2022-10-22 ENCOUNTER — Other Ambulatory Visit: Payer: Self-pay | Admitting: Physician Assistant

## 2022-10-22 ENCOUNTER — Encounter: Payer: Self-pay | Admitting: Urology

## 2022-10-22 DIAGNOSIS — N1330 Unspecified hydronephrosis: Secondary | ICD-10-CM | POA: Diagnosis not present

## 2022-10-22 DIAGNOSIS — N200 Calculus of kidney: Secondary | ICD-10-CM | POA: Diagnosis not present

## 2022-10-22 DIAGNOSIS — N10 Acute tubulo-interstitial nephritis: Secondary | ICD-10-CM | POA: Diagnosis not present

## 2022-10-22 DIAGNOSIS — N390 Urinary tract infection, site not specified: Secondary | ICD-10-CM | POA: Diagnosis not present

## 2022-10-22 LAB — URINE CULTURE: Culture: >=100000 — AB

## 2022-10-22 MED ORDER — OXYBUTYNIN CHLORIDE 5 MG PO TABS: 5.0000 mg | ORAL_TABLET | Freq: Four times a day (QID) | ORAL | 0 refills | Status: DC | PRN

## 2022-10-22 MED ORDER — OXYCODONE-ACETAMINOPHEN 5-325 MG PO TABS: 1.0000 | ORAL_TABLET | Freq: Four times a day (QID) | ORAL | 0 refills | Status: AC | PRN

## 2022-10-22 MED ORDER — CEPHALEXIN 500 MG PO CAPS: 500.0000 mg | ORAL_CAPSULE | Freq: Three times a day (TID) | ORAL | 0 refills | Status: AC

## 2022-10-22 MED ORDER — OXYCODONE HCL 5 MG PO TABS: 5.0000 mg | ORAL_TABLET | Freq: Four times a day (QID) | ORAL | 0 refills | Status: DC | PRN

## 2022-10-22 MED ORDER — TAMSULOSIN HCL 0.4 MG PO CAPS: 0.4000 mg | ORAL_CAPSULE | Freq: Every day | ORAL | 0 refills | Status: AC

## 2022-10-22 NOTE — Discharge Summary (Signed)
Physician Discharge Summary   Patient: Robin Diaz MRN: [id number] DOB: [birth year]  Admit date:     10/19/2022  Discharge date: 10/22/22  Discharge Physician: Marrion Coy   PCP: [facility]   Recommendations at discharge:   Follow-up with PCP in 1 week. With urology as scheduled by them.  Discharge Diagnoses: Principal Problem:   Nephrolithiasis Active Problems:   Multiple sclerosis (HCC)   Bilateral hydronephrosis   Acute lower UTI   Influenza A   Acute pyelonephritis  Resolved Problems:   * No resolved hospital problems. *  Hospital Course: Robin Diaz is a 41 y.o. female with medical history significant of hypertension, Multiple sclerosis w recent UTI x2, who presents to the hospital with fever and right flank pain.  CT scan showed bilateral kidney stone with hydronephrosis, patient was brought to the OR by urology on 12/24, bilateral ureteral stent placed. Patient currently on IV antibiotics. Initial culture came back with E. coli in the urine, urine culture from surgery alcohol negative.  Patient is clinically improved, will continue 7 days of oral Keflex.  Assessment and Plan: Bilateral nephrolithiasis with hydronephrosis. Acute pyelonephritis due to E. coli, secondary to hydronephrosis. Urine culture finalized with E. coli, pansensitive.  Will continue 7 days of Keflex. Patient condition has improved, bladder spasm has better controlled.  Will continue Flomax and Detrol.  As needed pain medicine for few days.   Influenza A. Patient no longer has any short of breath, still have a cough.  Symptoms started a week ago.   Multiple sclerosis. Continue home medicines schedule of monthly injection.        Consultants: Urology Procedures performed: Ureteral stent. Disposition: Home Diet recommendation:  Discharge Diet Orders (From admission, onward)     Start     Ordered   10/22/22 0000  Diet - low sodium heart healthy        10/22/22  0907           Cardiac diet DISCHARGE MEDICATION: Allergies as of 10/22/2022       Reactions   Gadobenate Swelling, Hives, Itching   Lower lip swelling. 11/2 - BREAKTHROUGH RXN - Hives/Itching on Rt side - treated with PO Benadryl. Darl Pikes RN   Lower lip swelling.   Iodinated Contrast Media    Other Reaction(s): Other (See Comments) Lip and tongue swelling        Medication List     STOP taking these medications    acyclovir 400 MG tablet Commonly known as: ZOVIRAX   Ozempic (2 MG/DOSE) 8 MG/3ML Sopn Generic drug: Semaglutide (2 MG/DOSE)       TAKE these medications    baclofen 10 MG tablet Commonly known as: LIORESAL Take 10 mg by mouth 3 (three) times daily.   cephALEXin 500 MG capsule Commonly known as: KEFLEX Take 1 capsule (500 mg total) by mouth 3 (three) times daily for 7 days. What changed: when to take this   cyanocobalamin 1000 MCG tablet Commonly known as: VITAMIN B12 Take by mouth.   Gilenya 0.5 MG Caps Generic drug: Fingolimod HCl Take by mouth.   ibuprofen 200 MG tablet Commonly known as: ADVIL Take by mouth.   levonorgestrel 20 MCG/24HR IUD Commonly known as: MIRENA 1 Intra Uterine Device (1 each total) by Intrauterine route once.   Lexapro 20 MG tablet Generic drug: escitalopram Take 20 mg by mouth daily.   losartan 50 MG tablet Commonly known as: COZAAR Take 50 mg by mouth daily.  melatonin 3 MG Tabs tablet Take 3 mg by mouth at bedtime.   Ofatumumab 20 MG/0.4ML Soaj Inject 20 mg into the skin every 28 (twenty-eight) days.   oxybutynin 5 MG tablet Commonly known as: DITROPAN Take 1 tablet (5 mg total) by mouth every 6 (six) hours as needed for bladder spasms (frequency,urgency).   oxyCODONE 5 MG immediate release tablet Commonly known as: Oxy IR/ROXICODONE Take 1 tablet (5 mg total) by mouth every 6 (six) hours as needed for moderate pain.   tamsulosin 0.4 MG Caps capsule Commonly known as: FLOMAX Take 1 capsule  (0.4 mg total) by mouth daily for 7 days.   Vitamin D-1000 Max St 25 MCG (1000 UT) tablet Generic drug: Cholecalciferol Take by mouth.        Follow-up Information     [place] Follow up in 1 week(s).   Contact information: [address] [place] [address] [phone]                Discharge Exam: Ceasar Mons Weights   10/19/22 1219  Weight: 83.9 kg   General exam: Appears calm and comfortable  Respiratory system: Clear to auscultation. Respiratory effort normal. Cardiovascular system: S1 & S2 heard, RRR. No JVD, murmurs, rubs, gallops or clicks. No pedal edema. Gastrointestinal system: Abdomen is nondistended, soft and nontender. No organomegaly or masses felt. Normal bowel sounds heard. Central nervous system: Alert and oriented. No focal neurological deficits. Extremities: Symmetric 5 x 5 power. Skin: No rashes, lesions or ulcers Psychiatry: Judgement and insight appear normal. Mood & affect appropriate.    Condition at discharge: good  The results of significant diagnostics from this hospitalization (including imaging, microbiology, ancillary and laboratory) are listed below for reference.   Imaging Studies: DG OR UROLOGY CYSTO IMAGE ([facility] ONLY)  Result Date: 10/20/2022 There is no interpretation for this exam.  This order is for images obtained during a surgical procedure.  Please See "Surgeries" Tab for more information regarding the procedure.   DG Chest 2 View  Result Date: 10/19/2022 CLINICAL DATA:  Cough EXAM: CHEST - 2 VIEW COMPARISON:  05/09/2020 FINDINGS: Stable cardiomediastinal silhouette. Both lungs are clear. The visualized skeletal structures are unremarkable. IMPRESSION: No active cardiopulmonary disease. Electronically Signed   By: Minerva Fester M.D.   On: 10/19/2022 21:09   CT Renal Stone Study  Result Date: 10/19/2022 CLINICAL DATA:  Abdominal pain. Flank pain. Foul smelling urine. Bladder region pain. EXAM: CT ABDOMEN  AND PELVIS WITHOUT CONTRAST TECHNIQUE: Multidetector CT imaging of the abdomen and pelvis was performed following the standard protocol without IV contrast. RADIATION DOSE REDUCTION: This exam was performed according to the departmental dose-optimization program which includes automated exposure control, adjustment of the mA and/or kV according to patient size and/or use of iterative reconstruction technique. COMPARISON:  10/05/2012. FINDINGS: Lower chest: Clear lung bases. Hepatobiliary: No focal liver abnormality is seen. No gallstones, gallbladder wall thickening, or biliary dilatation. Pancreas: Unremarkable. No pancreatic ductal dilatation or surrounding inflammatory changes. Spleen: Normal in size without focal abnormality. Adrenals/Urinary Tract: Normal adrenal glands. Moderate, right greater than left, hydronephrosis. Large stones lie in both renal pelvises, crossing the ureteropelvic junctions, both 2.2 cm in size. Several small stones noted in the lower poles of both kidneys. Mild bilateral perinephric stranding, adjacent to intrarenal collecting systems and along the inferior aspect of the right kidney. No abscess. No renal mass. Ureters are normal in course and in caliber. No ureteral stones. Bladder minimally distended. No convincing wall thickening,  stone or mass. Stomach/Bowel: Normal stomach. Small bowel and colon are normal in caliber. No wall thickening. No inflammation. No evidence of appendicitis. Vascular/Lymphatic: No significant vascular findings are present. No enlarged abdominal or pelvic lymph nodes. Reproductive: Well-positioned IUD. Uterus otherwise unremarkable. No adnexal masses. Other: No abdominal wall hernia or abnormality. No abdominopelvic ascites. Musculoskeletal: No fracture or acute finding.  No bone lesion. IMPRESSION: 1. Moderate, right greater than left, bilateral hydronephrosis. This appears due to to large bilateral renal pelvis stones causing ball valve type obstructions  at the ureteropelvic junctions. No other acute abnormality. 2. No ureteral stone or dilation. 3. Additional small nonobstructing stones in both kidneys. Electronically Signed   By: Amie Portland M.D.   On: 10/19/2022 13:05    Microbiology: Results for orders placed or performed during the hospital encounter of 10/19/22  Urine Culture     Status: Abnormal   Collection Time: 10/19/22 12:18 PM   Specimen: Urine, Clean Catch  Result Value Ref Range Status   Specimen Description   Final    URINE, CLEAN CATCH Performed at [facility], [address], [place], [place] [address]    Special Requests   Final    NONE Performed at [facility], [address] [place], [place] [address]    Culture >=100,000 COLONIES/mL ESCHERICHIA COLI (A)  Final   Report Status 10/22/2022 FINAL  Final   Organism ID, Bacteria ESCHERICHIA COLI (A)  Final      Susceptibility   Escherichia coli - MIC*    AMPICILLIN <=2 SENSITIVE Sensitive     CEFAZOLIN <=4 SENSITIVE Sensitive     CEFEPIME <=0.12 SENSITIVE Sensitive     CEFTRIAXONE <=0.25 SENSITIVE Sensitive     CIPROFLOXACIN <=0.25 SENSITIVE Sensitive     GENTAMICIN <=1 SENSITIVE Sensitive     IMIPENEM <=0.25 SENSITIVE Sensitive     NITROFURANTOIN 32 SENSITIVE Sensitive     TRIMETH/SULFA <=20 SENSITIVE Sensitive     AMPICILLIN/SULBACTAM <=2 SENSITIVE Sensitive     PIP/TAZO <=4 SENSITIVE Sensitive     * >=100,000 COLONIES/mL ESCHERICHIA COLI  Resp panel by RT-PCR (RSV, Flu A&B, Covid) Anterior Nasal Swab     Status: Abnormal   Collection Time: 10/19/22  7:00 PM   Specimen: Anterior Nasal Swab  Result Value Ref Range Status   SARS Coronavirus 2 by RT PCR NEGATIVE NEGATIVE Final    Comment: (NOTE) SARS-CoV-2 target nucleic acids are NOT DETECTED.  The SARS-CoV-2 RNA is generally detectable in upper respiratory specimens during the acute phase of infection. The lowest concentration of SARS-CoV-2 viral copies this assay can detect is 138  copies/mL. A negative result does not preclude SARS-Cov-2 infection and should not be used as the sole basis for treatment or other patient management decisions. A negative result may occur with  improper specimen collection/handling, submission of specimen other than nasopharyngeal swab, presence of viral mutation(s) within the areas targeted by this assay, and inadequate number of viral copies(<138 copies/mL). A negative result must be combined with clinical observations, patient history, and epidemiological information. The expected result is Negative.  Fact Sheet for Patients:  BloggerCourse.com  Fact Sheet for Healthcare Providers:  SeriousBroker.it  This test is no t yet approved or cleared by the [place] FDA and  has been authorized for detection and/or diagnosis of SARS-CoV-2 by FDA under an Emergency Use Authorization (EUA). This EUA will remain  in effect (meaning this test can be used) for the duration of the COVID-19 declaration under Section 564(b)(1)  of the Act, 21 U.S.C.section 360bbb-3(b)(1), unless the authorization is terminated  or revoked sooner.       Influenza A by PCR POSITIVE (A) NEGATIVE Final   Influenza B by PCR NEGATIVE NEGATIVE Final    Comment: (NOTE) The Xpert Xpress SARS-CoV-2/FLU/RSV plus assay is intended as an aid in the diagnosis of influenza from Nasopharyngeal swab specimens and should not be used as a sole basis for treatment. Nasal washings and aspirates are unacceptable for Xpert Xpress SARS-CoV-2/FLU/RSV testing.  Fact Sheet for Patients: BloggerCourse.com  Fact Sheet for Healthcare Providers: SeriousBroker.it  This test is not yet approved or cleared by the [place] FDA and has been authorized for detection and/or diagnosis of SARS-CoV-2 by FDA under an Emergency Use Authorization (EUA). This EUA will remain in effect  (meaning this test can be used) for the duration of the COVID-19 declaration under Section 564(b)(1) of the Act, 21 U.S.C. section 360bbb-3(b)(1), unless the authorization is terminated or revoked.     Resp Syncytial Virus by PCR NEGATIVE NEGATIVE Final    Comment: (NOTE) Fact Sheet for Patients: BloggerCourse.com  Fact Sheet for Healthcare Providers: SeriousBroker.it  This test is not yet approved or cleared by the [place] FDA and has been authorized for detection and/or diagnosis of SARS-CoV-2 by FDA under an Emergency Use Authorization (EUA). This EUA will remain in effect (meaning this test can be used) for the duration of the COVID-19 declaration under Section 564(b)(1) of the Act, 21 U.S.C. section 360bbb-3(b)(1), unless the authorization is terminated or revoked.  Performed at [facility], [address] [place], [place] [address]   Urine Culture     Status: None   Collection Time: 10/20/22 10:07 AM   Specimen: Urine, Catheterized  Result Value Ref Range Status   Specimen Description   Final    URINE, CATHETERIZED Performed at [facility], [address] [place], [place] [address]    Special Requests  RIGHT RENAL PELVIS  Final   Culture   Final    NO GROWTH Performed at [facility], [address], [place], [place] [address]    Report Status 10/21/2022 FINAL  Final  Urine Culture     Status: None   Collection Time: 10/20/22 10:09 AM   Specimen: Urine, Catheterized  Result Value Ref Range Status   Specimen Description   Final    URINE, CATHETERIZED Performed at [facility], [address] [place], [place] [address]    Special Requests LEFT RENAL PELVIS  Final   Culture   Final    NO GROWTH Performed at [facility], [address] [place], [place] [address]    Report Status 10/21/2022 FINAL  Final    Labs: CBC: Recent Labs  Lab 10/19/22 1644  10/20/22 0517  WBC 10.2 9.4  NEUTROABS 8.2*  --   HGB 12.5 10.1*  HCT 37.9 30.5*  MCV 87.5 88.7  PLT 313 241   Basic Metabolic Panel: Recent Labs  Lab 10/19/22 1644 10/20/22 0517  NA 141 141  K 4.5 4.2  CL 107 110  CO2 25 26  GLUCOSE 100* 110*  BUN 14 14  CREATININE 0.89 0.81  CALCIUM 9.3 8.6*   Liver Function Tests: No results for input(s): "AST", "ALT", "ALKPHOS", "BILITOT", "PROT", "ALBUMIN" in the last 168 hours. CBG: No results for input(s): "GLUCAP" in the last 168 hours.  Discharge time spent: greater than 30 minutes.  Signed: Marrion Coy, MD [facility] 10/22/2022

## 2022-10-22 NOTE — Progress Notes (Signed)
Urology Inpatient Progress Note  Subjective: No acute events overnight.  She is afebrile, VSS. A.m. labs not available.  Urine culture growing gram-negative rods, on antibiotics as below.  IntraOp urine cultures have finalized with no growth. She is voiding spontaneously.  She reports some bladder pressure, flank pain, and gross hematuria that is responding well to oxybutynin 5 mg every 6 hours.  She is already on Flomax as well.  Anti-infectives: Anti-infectives (From admission, onward)    Start     Dose/Rate Route Frequency Ordered Stop   10/20/22 1600  cefTRIAXone (ROCEPHIN) 2 g in sodium chloride 0.9 % 100 mL IVPB        2 g 200 mL/hr over 30 Minutes Intravenous Every 24 hours 10/19/22 1958     10/19/22 1700  cefTRIAXone (ROCEPHIN) 1 g in sodium chloride 0.9 % 100 mL IVPB  Status:  Discontinued        1 g 200 mL/hr over 30 Minutes Intravenous Every 24 hours 10/19/22 1651 10/19/22 1959   10/19/22 1645  cefTRIAXone (ROCEPHIN) injection 1 g  Status:  Discontinued        1 g Intramuscular  Once 10/19/22 1631 10/19/22 1651       Current Facility-Administered Medications  Medication Dose Route Frequency Provider Last Rate Last Admin   acetaminophen (TYLENOL) tablet 650 mg  650 mg Oral Q6H PRN Pearson Grippe, MD   650 mg at 10/21/22 0146   Or   acetaminophen (TYLENOL) suppository 650 mg  650 mg Rectal Q6H PRN Pearson Grippe, MD       benzonatate (TESSALON) capsule 100 mg  100 mg Oral TID PRN Pearson Grippe, MD   100 mg at 10/21/22 1946   bisacodyl (DULCOLAX) suppository 10 mg  10 mg Rectal Daily PRN Marrion Coy, MD   10 mg at 10/20/22 1621   cefTRIAXone (ROCEPHIN) 2 g in sodium chloride 0.9 % 100 mL IVPB  2 g Intravenous Q24H Pearson Grippe, MD 200 mL/hr at 10/21/22 1615 2 g at 10/21/22 1615   enoxaparin (LOVENOX) injection 40 mg  40 mg Subcutaneous Q24H Pearson Grippe, MD   40 mg at 10/21/22 1611   escitalopram (LEXAPRO) tablet 20 mg  20 mg Oral Farrel Demark, MD   20 mg at 10/21/22 2214   losartan  (COZAAR) tablet 25 mg  25 mg Oral Daily Pearson Grippe, MD   25 mg at 10/21/22 0856   melatonin tablet 5 mg  5 mg Oral QHS Pearson Grippe, MD   5 mg at 10/21/22 2214   morphine (PF) 2 MG/ML injection 1 mg  1 mg Intravenous Q4H PRN Pearson Grippe, MD       ondansetron [facility]) injection 4 mg  4 mg Intravenous Q6H PRN Pearson Grippe, MD       oxybutynin (DITROPAN) tablet 5 mg  5 mg Oral Q6H PRN Marrion Coy, MD   5 mg at 10/22/22 6754   oxyCODONE (Oxy IR/ROXICODONE) immediate release tablet 5 mg  5 mg Oral Q4H PRN Pearson Grippe, MD   5 mg at 10/22/22 0443   senna-docusate (Senokot-S) tablet 2 tablet  2 tablet Oral BID Marrion Coy, MD   2 tablet at 10/21/22 2214   tamsulosin (FLOMAX) capsule 0.4 mg  0.4 mg Oral Daily Stoioff, Scott C, MD   0.4 mg at 10/21/22 0856   Objective: Vital signs in last 24 hours: Temp:  [97.5 F (36.4 C)-98.2 F (36.8 C)] 98.2 F (36.8 C) (12/26 0424) Pulse Rate:  [56-64] 61 (12/26  0424) Resp:  [16-20] 16 (12/26 0424) BP: (105-125)/(61-78) 122/74 (12/26 0424) SpO2:  [95 %-98 %] 95 % (12/26 0424)  Intake/Output from previous day: 12/25 0701 - 12/26 0700 In: 480 [P.O.:480] Out: -  Intake/Output this shift: No intake/output data recorded.  Physical Exam Vitals and nursing note reviewed.  Constitutional:      General: She is not in acute distress.    Appearance: She is not ill-appearing, toxic-appearing or diaphoretic.  HENT:     Head: Normocephalic and atraumatic.  Pulmonary:     Effort: Pulmonary effort is normal. No respiratory distress.  Skin:    General: Skin is warm and dry.  Neurological:     Mental Status: She is alert and oriented to person, place, and time.  Psychiatric:        Mood and Affect: Mood normal.        Behavior: Behavior normal.    Lab Results:  Recent Labs    10/19/22 1644 10/20/22 0517  WBC 10.2 9.4  HGB 12.5 10.1*  HCT 37.9 30.5*  PLT 313 241   BMET Recent Labs    10/19/22 1644 10/20/22 0517  NA 141 141  K 4.5 4.2  CL 107 110   CO2 25 26  GLUCOSE 100* 110*  BUN 14 14  CREATININE 0.89 0.81  CALCIUM 9.3 8.6*   PT/INR Recent Labs    10/20/22 0517  LABPROT 15.1  INR 1.2   Assessment & Plan: 41 year old female s/p bilateral ureteral stent placement with Dr. Lonna Cobb for management of bilateral renal pelvic calculi with bilateral hydronephrosis and UTI.  He is clinically improving today on empiric antibiotics.  She is having marked stent symptoms that are well-managed on Flomax 0.4 mg daily and oxybutynin 5 mg every 6 hours scheduled.  Recommend continuing this on discharge.  We discussed that she will require definitive stone management.  Dr. Lonna Cobb previously discussed staged ureteroscopy versus bilateral PCNL with her.  I will schedule her for outpatient follow-up in our clinic to discuss this further.  Okay for discharge from the urologic perspective with medication recommendations as above and plans for outpatient follow-up as above.  Carman Ching, PA-C 10/22/2022

## 2022-10-22 NOTE — TOC CM/SW Note (Signed)
MOON delivered, pt discharging no TOC needs. CSW signing off.

## 2022-10-22 NOTE — Care Management Obs Status (Signed)
MEDICARE OBSERVATION STATUS NOTIFICATION   Patient Details  Name: Robin Diaz MRN: [id number] Date of Birth: 1981-10-21   Medicare Observation Status Notification Given:  Yes    Quamaine Webb, LCSW 10/22/2022, 9:08 AM

## 2022-10-30 ENCOUNTER — Encounter: Payer: Self-pay | Admitting: Urology

## 2022-10-30 ENCOUNTER — Ambulatory Visit: Payer: BC Managed Care – PPO | Admitting: Urology

## 2022-10-30 ENCOUNTER — Telehealth: Payer: Self-pay

## 2022-10-30 VITALS — BP 122/81 | HR 78 | Ht 67.0 in | Wt 185.0 lb

## 2022-10-30 DIAGNOSIS — N200 Calculus of kidney: Secondary | ICD-10-CM

## 2022-10-30 LAB — URINALYSIS, COMPLETE
Bilirubin, UA: NEGATIVE
Glucose, UA: NEGATIVE
Ketones, UA: NEGATIVE
Nitrite, UA: NEGATIVE
Specific Gravity, UA: 1.01 (ref 1.005–1.030)
Urobilinogen, Ur: 0.2 mg/dL (ref 0.2–1.0)
pH, UA: 7 (ref 5.0–7.5)

## 2022-10-30 LAB — MICROSCOPIC EXAMINATION
RBC, Urine: >30 /hpf — AB (ref 0–2)
WBC, UA: >30 /hpf — AB (ref 0–5)

## 2022-10-30 MED ORDER — CEPHALEXIN 500 MG PO CAPS: 500.0000 mg | ORAL_CAPSULE | Freq: Three times a day (TID) | ORAL | 0 refills | Status: DC

## 2022-10-30 MED ORDER — OXYCODONE HCL 5 MG PO TABS: 5.0000 mg | ORAL_TABLET | Freq: Four times a day (QID) | ORAL | 0 refills | Status: DC | PRN

## 2022-10-30 MED ORDER — OXYBUTYNIN CHLORIDE 5 MG PO TABS: 5.0000 mg | ORAL_TABLET | Freq: Four times a day (QID) | ORAL | 0 refills | Status: DC | PRN

## 2022-10-30 MED ORDER — TAMSULOSIN HCL 0.4 MG PO CAPS: 0.4000 mg | ORAL_CAPSULE | Freq: Every day | ORAL | 0 refills | Status: DC

## 2022-10-30 NOTE — Telephone Encounter (Signed)
Pt LM on triage line stating that she needs her medications sent to [facility] not [facility]. Pharmacy updated in chart, will route to provider as controlled meds were sent in.

## 2022-10-30 NOTE — H&P (View-Only) (Signed)
10/30/2022 3:05 PM   Robin Diaz 1981/05/23 [id number]  Referring provider: [facility] [address] [place],  [place] [address]  Chief Complaint  Patient presents with   Nephrolithiasis    HPI: 42 y.o. female presents for follow-up visit.  Hospitalized 10/19/2022 with 1 week history of dysuria, frequency, urgency and fever.  UA with significant pyuria and a stone protocol CT showed bilateral renal pelvic calculi measuring >2 cm with bilateral moderate hydronephrosis R >L.  Creatinine was normal. Underwent cystoscopy with bilateral ureteral stent placement 10/20/2022 Urine culture grew pansensitive E. coli.   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)   Multiple sclerosis ([place]) 2015   Rh negative, maternal     Surgical History: Past Surgical History:  Procedure Laterality Date   BREAST REDUCTION SURGERY  02/2002   CYSTOSCOPY W/ RETROGRADES Bilateral 10/20/2022   Procedure: CYSTOSCOPY WITH RETROGRADE PYELOGRAM;  Surgeon: Abbie Sons, MD;  Location: [facility];  Service: Urology;  Laterality: Bilateral;   CYSTOSCOPY WITH STENT PLACEMENT Bilateral 10/20/2022   Procedure: CYSTOSCOPY WITH BILATERAL  STENT PLACEMENT;  Surgeon: Abbie Sons, MD;  Location: [facility];  Service: Urology;  Laterality: Bilateral;   INTRAUTERINE DEVICE (IUD) INSERTION      Home Medications:  Allergies as of 10/30/2022       Reactions   Gadobenate Swelling, Hives, Itching   Lower lip swelling. 11/2 - BREAKTHROUGH RXN - Hives/Itching on Rt side - treated with PO Benadryl. Manuela Schwartz RN   Lower lip swelling.   Iodinated Contrast Media    Other Reaction(s): Other (See Comments) Lip and tongue swelling         Medication List        Accurate as of October 30, 2022  3:05 PM. If you have any questions, ask your nurse or doctor.          STOP taking these medications    Gilenya 0.5 MG Caps Generic drug: Fingolimod HCl Stopped by: Abbie Sons, MD   levonorgestrel 20 MCG/24HR IUD Commonly known as: MIRENA Stopped by: Abbie Sons, MD   losartan 50 MG tablet Commonly known as: COZAAR Stopped by: Abbie Sons, MD   oxyCODONE 5 MG immediate release tablet Commonly known as: Oxy IR/ROXICODONE Stopped by: Abbie Sons, MD       TAKE these medications    baclofen 10 MG tablet Commonly known as: LIORESAL Take 10 mg by mouth 3 (three) times daily.   cyanocobalamin 1000 MCG tablet Commonly known as: VITAMIN B12 Take by mouth.   ibuprofen 200 MG tablet Commonly known as: ADVIL Take by mouth.   Lexapro 20 MG tablet Generic drug: escitalopram Take 20 mg by mouth daily.   melatonin 3 MG Tabs tablet Take 3 mg by mouth at bedtime.   Ofatumumab 20 MG/0.4ML Soaj Inject 20 mg into the skin every 28 (twenty-eight) days.   oxybutynin 5 MG tablet Commonly known as: DITROPAN Take 1 tablet (5 mg total) by mouth every 6 (six) hours as needed for bladder spasms (frequency,urgency).   Vitamin D-1000 Max St 25 MCG (1000 UT) tablet Generic drug: Cholecalciferol Take by mouth.        Allergies:  Allergies  Allergen Reactions   Gadobenate Swelling, Hives and Itching    Lower lip swelling.  11/2 - BREAKTHROUGH RXN - Hives/Itching on Rt side - treated with PO Benadryl. Manuela Schwartz RN   Lower lip swelling.   Iodinated Contrast Media     Other Reaction(s): Other (See Comments)  Lip and tongue swelling    Family History: Family History  Problem Relation Age of Onset   Hypertension Father    Colon cancer Maternal Uncle 48   Breast cancer Paternal Grandmother 53       second   Colon cancer Paternal Grandmother 59       first   Lung cancer Paternal Grandmother         third   Breast cancer Cousin 76       paternal cousin    Social History:  reports that she has never smoked. She has never used smokeless tobacco. She reports that she does not drink alcohol and does not use drugs.   Physical Exam: BP 122/81   Pulse 78   Ht 5\' 7"  (1.702 m)   Wt 185 lb (83.9 kg)   BMI 28.98 kg/m   Constitutional:  Alert and oriented, No acute distress. HEENT: [place] AT Respiratory: Normal respiratory effort, no increased work of breathing. Neurologic: Grossly intact, no focal deficits, moving all 4 extremities. Psychiatric: Normal mood and affect.  Laboratory Data:  Urinalysis >30 WBC/RBC   Pertinent Imaging: CT images were personally reviewed and interpreted   CT Renal Stone Study  Narrative CLINICAL DATA:  Abdominal pain. Flank pain. Foul smelling urine. Bladder region pain.  EXAM: CT ABDOMEN AND PELVIS WITHOUT CONTRAST  TECHNIQUE: Multidetector CT imaging of the abdomen and pelvis was performed following the standard protocol without IV contrast.  RADIATION DOSE REDUCTION: This exam was performed according to the departmental dose-optimization program which includes automated exposure control, adjustment of the mA and/or kV according to patient size and/or use of iterative reconstruction technique.  COMPARISON:  10/05/2012.  FINDINGS: Lower chest: Clear lung bases.  Hepatobiliary: No focal liver abnormality is seen. No gallstones, gallbladder wall thickening, or biliary dilatation.  Pancreas: Unremarkable. No pancreatic ductal dilatation or surrounding inflammatory changes.  Spleen: Normal in size without focal abnormality.  Adrenals/Urinary Tract: Normal adrenal glands.  Moderate, right greater than left, hydronephrosis. Large stones lie in both renal pelvises, crossing the ureteropelvic junctions, both 2.2 cm in size. Several small stones noted in the lower poles of both kidneys. Mild bilateral perinephric stranding, adjacent  to intrarenal collecting systems and along the inferior aspect of the right kidney. No abscess. No renal mass.  Ureters are normal in course and in caliber. No ureteral stones. Bladder minimally distended. No convincing wall thickening, stone or mass.  Stomach/Bowel: Normal stomach. Small bowel and colon are normal in caliber. No wall thickening. No inflammation. No evidence of appendicitis.  Vascular/Lymphatic: No significant vascular findings are present. No enlarged abdominal or pelvic lymph nodes.  Reproductive: Well-positioned IUD. Uterus otherwise unremarkable. No adnexal masses.  Other: No abdominal wall hernia or abnormality. No abdominopelvic ascites.  Musculoskeletal: No fracture or acute finding.  No bone lesion.  IMPRESSION: 1. Moderate, right greater than left, bilateral hydronephrosis. This appears due to to large bilateral renal pelvis stones causing ball valve type obstructions at the ureteropelvic junctions. No other acute abnormality. 2. No ureteral stone or dilation. 3. Additional small nonobstructing stones in both kidneys.   Electronically Signed By: Lajean Manes M.D. On: 10/19/2022 13:05   Assessment & Plan:  1.  Bilateral nephrolithiasis We discussed various treatment options for urolithiasis including shockwave lithotripsy (SWL), ureteroscopy and laser lithotripsy with stent placement, and percutaneous nephrolithotomy.  We discussed that management is based on stone size, location, density, patient co-morbidities, and patient preference.   SWL has a lower stone free rate in a single procedure, but also a lower complication rate compared to ureteroscopy and avoids a stent and associated stent related symptoms. Possible complications include renal hematoma, steinstrasse, and need for additional treatment.  Would not recommend SWL not recommended based on stone size and density  Ureteroscopy with laser lithotripsy and stent placement has a higher  stone free rate than SWL in a single procedure, however increased complication rate including possible infection, ureteral injury, bleeding, and stent related morbidity. Common stent related symptoms include dysuria, urgency/frequency, and flank pain.  PCNL is the favored treatment for stones >2cm. It involves a small incision in the flank, with complete fragmentation of stones and removal. It has the highest stone free rate, but also the highest complication rate. Possible complications include bleeding, infection/sepsis, injury to surrounding organs including the pleura, and collecting system injury.   Since she has bilateral renal calculi will need to have a staged procedure.  After an extensive discussion of the risks and benefits of the above treatment options, the patient would like to proceed with ureteroscopy.  We discussed the possible need for residual fragments and the need for more than 2 procedures.  All questions were answered and she desires to schedule   Abbie Sons, MD  [facility] [address], [place], [place] [address] [phone]

## 2022-10-30 NOTE — H&P (View-Only) (Signed)
10/30/2022 3:05 PM   Robin Diaz 1981/05/23 [id number]  Referring provider: [facility] [address] [place],  [place] [address]  Chief Complaint  Patient presents with   Nephrolithiasis    HPI: 42 y.o. female presents for follow-up visit.  Hospitalized 10/19/2022 with 1 week history of dysuria, frequency, urgency and fever.  UA with significant pyuria and a stone protocol CT showed bilateral renal pelvic calculi measuring >2 cm with bilateral moderate hydronephrosis R >L.  Creatinine was normal. Underwent cystoscopy with bilateral ureteral stent placement 10/20/2022 Urine culture grew pansensitive E. coli.   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)   Multiple sclerosis ([place]) 2015   Rh negative, maternal     Surgical History: Past Surgical History:  Procedure Laterality Date   BREAST REDUCTION SURGERY  02/2002   CYSTOSCOPY W/ RETROGRADES Bilateral 10/20/2022   Procedure: CYSTOSCOPY WITH RETROGRADE PYELOGRAM;  Surgeon: Abbie Sons, MD;  Location: [facility];  Service: Urology;  Laterality: Bilateral;   CYSTOSCOPY WITH STENT PLACEMENT Bilateral 10/20/2022   Procedure: CYSTOSCOPY WITH BILATERAL  STENT PLACEMENT;  Surgeon: Abbie Sons, MD;  Location: [facility];  Service: Urology;  Laterality: Bilateral;   INTRAUTERINE DEVICE (IUD) INSERTION      Home Medications:  Allergies as of 10/30/2022       Reactions   Gadobenate Swelling, Hives, Itching   Lower lip swelling. 11/2 - BREAKTHROUGH RXN - Hives/Itching on Rt side - treated with PO Benadryl. Manuela Schwartz RN   Lower lip swelling.   Iodinated Contrast Media    Other Reaction(s): Other (See Comments) Lip and tongue swelling         Medication List        Accurate as of October 30, 2022  3:05 PM. If you have any questions, ask your nurse or doctor.          STOP taking these medications    Gilenya 0.5 MG Caps Generic drug: Fingolimod HCl Stopped by: Abbie Sons, MD   levonorgestrel 20 MCG/24HR IUD Commonly known as: MIRENA Stopped by: Abbie Sons, MD   losartan 50 MG tablet Commonly known as: COZAAR Stopped by: Abbie Sons, MD   oxyCODONE 5 MG immediate release tablet Commonly known as: Oxy IR/ROXICODONE Stopped by: Abbie Sons, MD       TAKE these medications    baclofen 10 MG tablet Commonly known as: LIORESAL Take 10 mg by mouth 3 (three) times daily.   cyanocobalamin 1000 MCG tablet Commonly known as: VITAMIN B12 Take by mouth.   ibuprofen 200 MG tablet Commonly known as: ADVIL Take by mouth.   Lexapro 20 MG tablet Generic drug: escitalopram Take 20 mg by mouth daily.   melatonin 3 MG Tabs tablet Take 3 mg by mouth at bedtime.   Ofatumumab 20 MG/0.4ML Soaj Inject 20 mg into the skin every 28 (twenty-eight) days.   oxybutynin 5 MG tablet Commonly known as: DITROPAN Take 1 tablet (5 mg total) by mouth every 6 (six) hours as needed for bladder spasms (frequency,urgency).   Vitamin D-1000 Max St 25 MCG (1000 UT) tablet Generic drug: Cholecalciferol Take by mouth.        Allergies:  Allergies  Allergen Reactions   Gadobenate Swelling, Hives and Itching    Lower lip swelling.  11/2 - BREAKTHROUGH RXN - Hives/Itching on Rt side - treated with PO Benadryl. Susan RN   Lower lip swelling.   Iodinated Contrast Media     Other Reaction(s): Other (See Comments)  Lip and tongue swelling    Family History: Family History  Problem Relation Age of Onset   Hypertension Father    Colon cancer Maternal Uncle 59   Breast cancer Paternal Grandmother 65       second   Colon cancer Paternal Grandmother 60       first   Lung cancer Paternal Grandmother         third   Breast cancer Cousin 38       paternal cousin    Social History:  reports that she has never smoked. She has never used smokeless tobacco. She reports that she does not drink alcohol and does not use drugs.   Physical Exam: BP 122/81   Pulse 78   Ht 5' 7" (1.702 m)   Wt 185 lb (83.9 kg)   BMI 28.98 kg/m   Constitutional:  Alert and oriented, No acute distress. HEENT: [place] AT Respiratory: Normal respiratory effort, no increased work of breathing. Neurologic: Grossly intact, no focal deficits, moving all 4 extremities. Psychiatric: Normal mood and affect.  Laboratory Data:  Urinalysis >30 WBC/RBC   Pertinent Imaging: CT images were personally reviewed and interpreted   CT Renal Stone Study  Narrative CLINICAL DATA:  Abdominal pain. Flank pain. Foul smelling urine. Bladder region pain.  EXAM: CT ABDOMEN AND PELVIS WITHOUT CONTRAST  TECHNIQUE: Multidetector CT imaging of the abdomen and pelvis was performed following the standard protocol without IV contrast.  RADIATION DOSE REDUCTION: This exam was performed according to the departmental dose-optimization program which includes automated exposure control, adjustment of the mA and/or kV according to patient size and/or use of iterative reconstruction technique.  COMPARISON:  10/05/2012.  FINDINGS: Lower chest: Clear lung bases.  Hepatobiliary: No focal liver abnormality is seen. No gallstones, gallbladder wall thickening, or biliary dilatation.  Pancreas: Unremarkable. No pancreatic ductal dilatation or surrounding inflammatory changes.  Spleen: Normal in size without focal abnormality.  Adrenals/Urinary Tract: Normal adrenal glands.  Moderate, right greater than left, hydronephrosis. Large stones lie in both renal pelvises, crossing the ureteropelvic junctions, both 2.2 cm in size. Several small stones noted in the lower poles of both kidneys. Mild bilateral perinephric stranding, adjacent  to intrarenal collecting systems and along the inferior aspect of the right kidney. No abscess. No renal mass.  Ureters are normal in course and in caliber. No ureteral stones. Bladder minimally distended. No convincing wall thickening, stone or mass.  Stomach/Bowel: Normal stomach. Small bowel and colon are normal in caliber. No wall thickening. No inflammation. No evidence of appendicitis.  Vascular/Lymphatic: No significant vascular findings are present. No enlarged abdominal or pelvic lymph nodes.  Reproductive: Well-positioned IUD. Uterus otherwise unremarkable. No adnexal masses.  Other: No abdominal wall hernia or abnormality. No abdominopelvic ascites.  Musculoskeletal: No fracture or acute finding.  No bone lesion.  IMPRESSION: 1. Moderate, right greater than left, bilateral hydronephrosis. This appears due to to large bilateral renal pelvis stones causing ball valve type obstructions at the ureteropelvic junctions. No other acute abnormality. 2. No ureteral stone or dilation. 3. Additional small nonobstructing stones in both kidneys.   Electronically Signed By: David  Ormond M.D. On: 10/19/2022 13:05   Assessment & Plan:      1.  Bilateral nephrolithiasis We discussed various treatment options for urolithiasis including shockwave lithotripsy (SWL), ureteroscopy and laser lithotripsy with stent placement, and percutaneous nephrolithotomy.  We discussed that management is based on stone size, location, density, patient co-morbidities, and patient preference.   SWL has a lower stone free rate in a single procedure, but also a lower complication rate compared to ureteroscopy and avoids a stent and associated stent related symptoms. Possible complications include renal hematoma, steinstrasse, and need for additional treatment.  Would not recommend SWL not recommended based on stone size and density  Ureteroscopy with laser lithotripsy and stent placement has a higher  stone free rate than SWL in a single procedure, however increased complication rate including possible infection, ureteral injury, bleeding, and stent related morbidity. Common stent related symptoms include dysuria, urgency/frequency, and flank pain.  PCNL is the favored treatment for stones >2cm. It involves a small incision in the flank, with complete fragmentation of stones and removal. It has the highest stone free rate, but also the highest complication rate. Possible complications include bleeding, infection/sepsis, injury to surrounding organs including the pleura, and collecting system injury.   Since she has bilateral renal calculi will need to have a staged procedure.  After an extensive discussion of the risks and benefits of the above treatment options, the patient would like to proceed with ureteroscopy.  We discussed the possible need for residual fragments and the need for more than 2 procedures.  All questions were answered and she desires to schedule   Abbie Sons, MD  [facility] [address], [place], [place] [address] [phone]

## 2022-10-30 NOTE — Progress Notes (Unsigned)
10/30/2022 3:05 PM   Sherron Ales 1981/05/23 [id number]  Referring provider: [facility] [address] [place],  [place] [address]  Chief Complaint  Patient presents with   Nephrolithiasis    HPI: 42 y.o. female presents for follow-up visit.  Hospitalized 10/19/2022 with 1 week history of dysuria, frequency, urgency and fever.  UA with significant pyuria and a stone protocol CT showed bilateral renal pelvic calculi measuring >2 cm with bilateral moderate hydronephrosis R >L.  Creatinine was normal. Underwent cystoscopy with bilateral ureteral stent placement 10/20/2022 Urine culture grew pansensitive E. coli.   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)   Multiple sclerosis ([place]) 2015   Rh negative, maternal     Surgical History: Past Surgical History:  Procedure Laterality Date   BREAST REDUCTION SURGERY  02/2002   CYSTOSCOPY W/ RETROGRADES Bilateral 10/20/2022   Procedure: CYSTOSCOPY WITH RETROGRADE PYELOGRAM;  Surgeon: Abbie Sons, MD;  Location: [facility] ORS;  Service: Urology;  Laterality: Bilateral;   CYSTOSCOPY WITH STENT PLACEMENT Bilateral 10/20/2022   Procedure: CYSTOSCOPY WITH BILATERAL  STENT PLACEMENT;  Surgeon: Abbie Sons, MD;  Location: [facility] ORS;  Service: Urology;  Laterality: Bilateral;   INTRAUTERINE DEVICE (IUD) INSERTION      Home Medications:  Allergies as of 10/30/2022       Reactions   Gadobenate Swelling, Hives, Itching   Lower lip swelling. 11/2 - BREAKTHROUGH RXN - Hives/Itching on Rt side - treated with PO Benadryl. Manuela Schwartz RN   Lower lip swelling.   Iodinated Contrast Media    Other Reaction(s): Other (See Comments) Lip and tongue swelling         Medication List        Accurate as of October 30, 2022  3:05 PM. If you have any questions, ask your nurse or doctor.          STOP taking these medications    Gilenya 0.5 MG Caps Generic drug: Fingolimod HCl Stopped by: Abbie Sons, MD   levonorgestrel 20 MCG/24HR IUD Commonly known as: MIRENA Stopped by: Abbie Sons, MD   losartan 50 MG tablet Commonly known as: COZAAR Stopped by: Abbie Sons, MD   oxyCODONE 5 MG immediate release tablet Commonly known as: Oxy IR/ROXICODONE Stopped by: Abbie Sons, MD       TAKE these medications    baclofen 10 MG tablet Commonly known as: LIORESAL Take 10 mg by mouth 3 (three) times daily.   cyanocobalamin 1000 MCG tablet Commonly known as: VITAMIN B12 Take by mouth.   ibuprofen 200 MG tablet Commonly known as: ADVIL Take by mouth.   Lexapro 20 MG tablet Generic drug: escitalopram Take 20 mg by mouth daily.   melatonin 3 MG Tabs tablet Take 3 mg by mouth at bedtime.   Ofatumumab 20 MG/0.4ML Soaj Inject 20 mg into the skin every 28 (twenty-eight) days.   oxybutynin 5 MG tablet Commonly known as: DITROPAN Take 1 tablet (5 mg total) by mouth every 6 (six) hours as needed for bladder spasms (frequency,urgency).   Vitamin D-1000 Max St 25 MCG (1000 UT) tablet Generic drug: Cholecalciferol Take by mouth.        Allergies:  Allergies  Allergen Reactions   Gadobenate Swelling, Hives and Itching    Lower lip swelling.  11/2 - BREAKTHROUGH RXN - Hives/Itching on Rt side - treated with PO Benadryl. Manuela Schwartz RN   Lower lip swelling.   Iodinated Contrast Media     Other Reaction(s): Other (See Comments)  Lip and tongue swelling    Family History: Family History  Problem Relation Age of Onset   Hypertension Father    Colon cancer Maternal Uncle 38   Breast cancer Paternal Grandmother 61       second   Colon cancer Paternal Grandmother 70       first   Lung cancer Paternal Grandmother         third   Breast cancer Cousin 29       paternal cousin    Social History:  reports that she has never smoked. She has never used smokeless tobacco. She reports that she does not drink alcohol and does not use drugs.   Physical Exam: BP 122/81   Pulse 78   Ht 5\' 7"  (1.702 m)   Wt 185 lb (83.9 kg)   BMI 28.98 kg/m   Constitutional:  Alert and oriented, No acute distress. HEENT: [place] AT Respiratory: Normal respiratory effort, no increased work of breathing. Neurologic: Grossly intact, no focal deficits, moving all 4 extremities. Psychiatric: Normal mood and affect.  Laboratory Data:  Urinalysis >30 WBC/RBC   Pertinent Imaging: CT images were personally reviewed and interpreted   CT Renal Stone Study  Narrative CLINICAL DATA:  Abdominal pain. Flank pain. Foul smelling urine. Bladder region pain.  EXAM: CT ABDOMEN AND PELVIS WITHOUT CONTRAST  TECHNIQUE: Multidetector CT imaging of the abdomen and pelvis was performed following the standard protocol without IV contrast.  RADIATION DOSE REDUCTION: This exam was performed according to the departmental dose-optimization program which includes automated exposure control, adjustment of the mA and/or kV according to patient size and/or use of iterative reconstruction technique.  COMPARISON:  10/05/2012.  FINDINGS: Lower chest: Clear lung bases.  Hepatobiliary: No focal liver abnormality is seen. No gallstones, gallbladder wall thickening, or biliary dilatation.  Pancreas: Unremarkable. No pancreatic ductal dilatation or surrounding inflammatory changes.  Spleen: Normal in size without focal abnormality.  Adrenals/Urinary Tract: Normal adrenal glands.  Moderate, right greater than left, hydronephrosis. Large stones lie in both renal pelvises, crossing the ureteropelvic junctions, both 2.2 cm in size. Several small stones noted in the lower poles of both kidneys. Mild bilateral perinephric stranding, adjacent  to intrarenal collecting systems and along the inferior aspect of the right kidney. No abscess. No renal mass.  Ureters are normal in course and in caliber. No ureteral stones. Bladder minimally distended. No convincing wall thickening, stone or mass.  Stomach/Bowel: Normal stomach. Small bowel and colon are normal in caliber. No wall thickening. No inflammation. No evidence of appendicitis.  Vascular/Lymphatic: No significant vascular findings are present. No enlarged abdominal or pelvic lymph nodes.  Reproductive: Well-positioned IUD. Uterus otherwise unremarkable. No adnexal masses.  Other: No abdominal wall hernia or abnormality. No abdominopelvic ascites.  Musculoskeletal: No fracture or acute finding.  No bone lesion.  IMPRESSION: 1. Moderate, right greater than left, bilateral hydronephrosis. This appears due to to large bilateral renal pelvis stones causing ball valve type obstructions at the ureteropelvic junctions. No other acute abnormality. 2. No ureteral stone or dilation. 3. Additional small nonobstructing stones in both kidneys.   Electronically Signed By: Lajean Manes M.D. On: 10/19/2022 13:05   Assessment & Plan:  1.  Bilateral nephrolithiasis We discussed various treatment options for urolithiasis including shockwave lithotripsy (SWL), ureteroscopy and laser lithotripsy with stent placement, and percutaneous nephrolithotomy.  We discussed that management is based on stone size, location, density, patient co-morbidities, and patient preference.   SWL has a lower stone free rate in a single procedure, but also a lower complication rate compared to ureteroscopy and avoids a stent and associated stent related symptoms. Possible complications include renal hematoma, steinstrasse, and need for additional treatment.  Would not recommend SWL not recommended based on stone size and density  Ureteroscopy with laser lithotripsy and stent placement has a higher  stone free rate than SWL in a single procedure, however increased complication rate including possible infection, ureteral injury, bleeding, and stent related morbidity. Common stent related symptoms include dysuria, urgency/frequency, and flank pain.  PCNL is the favored treatment for stones >2cm. It involves a small incision in the flank, with complete fragmentation of stones and removal. It has the highest stone free rate, but also the highest complication rate. Possible complications include bleeding, infection/sepsis, injury to surrounding organs including the pleura, and collecting system injury.   Since she has bilateral renal calculi will need to have a staged procedure.  After an extensive discussion of the risks and benefits of the above treatment options, the patient would like to proceed with ureteroscopy.  We discussed the possible need for residual fragments and the need for more than 2 procedures.  All questions were answered and she desires to schedule   Abbie Sons, MD  [facility] [address], [place], [place] [address] [phone]

## 2022-10-31 ENCOUNTER — Encounter: Payer: Self-pay | Admitting: Urology

## 2022-10-31 ENCOUNTER — Telehealth: Payer: Self-pay

## 2022-10-31 ENCOUNTER — Other Ambulatory Visit: Payer: Self-pay | Admitting: Urology

## 2022-10-31 DIAGNOSIS — N200 Calculus of kidney: Secondary | ICD-10-CM

## 2022-10-31 MED ORDER — OXYCODONE HCL 5 MG PO TABS: 5.0000 mg | ORAL_TABLET | Freq: Four times a day (QID) | ORAL | 0 refills | Status: DC | PRN

## 2022-10-31 MED ORDER — CEPHALEXIN 500 MG PO CAPS: 500.0000 mg | ORAL_CAPSULE | Freq: Three times a day (TID) | ORAL | 0 refills | Status: DC

## 2022-10-31 MED ORDER — TAMSULOSIN HCL 0.4 MG PO CAPS: 0.4000 mg | ORAL_CAPSULE | Freq: Every day | ORAL | 0 refills | Status: DC

## 2022-10-31 NOTE — Telephone Encounter (Signed)
I spoke with Mrs. Robin Diaz. We have discussed possible surgery dates and Tuesday January 9th, 2024 was agreed upon by all parties. Patient given information about surgery date, what to expect pre-operatively and post operatively.  We discussed that a Pre-Admission Testing office will be calling to set up the pre-op visit that will take place prior to surgery, and that these appointments are typically done over the phone with a Pre-Admissions RN. Informed patient that our office will communicate any additional care to be provided after surgery. Patients questions or concerns were discussed during our call. Advised to call our office should there be any additional information, questions or concerns that arise. Patient verbalized understanding.

## 2022-10-31 NOTE — Progress Notes (Signed)
    [facility] From  Surgery Date: Date: 11/05/2022  Surgeon: Dr. John Giovanni, MD  Inpt ( No  )   Outpt (Yes)   Obs ( No  )   Diagnosis: N20.0 Right Nephrolithiasis  -CPT: [phone]  Surgery: Right Ureteroscopy with laser lithotripsy and stent exchange  Stop Anticoagulations: No  Cardiac/Medical/Pulmonary Clearance needed: no  *Orders entered into EPIC  Date: 10/31/22   *Case booked in EPIC  Date: 10/31/22  *Notified pt of Surgery: Date: 10/31/22  PRE-OP UA & CX: yes, obtained in clinic on 10/30/2022  *Placed into Prior Authorization Work Fabio Bering Date: 10/31/22  Assistant/laser/rep:No

## 2022-10-31 NOTE — Progress Notes (Signed)
Surgical Physician Order Form [facility]   * Scheduling expectation : ASAP  *Length of Case: 2.5 hrs  *Clearance needed: no  *Anticoagulation Instructions: N/A  *Aspirin Instructions: N/A  *Post-op visit Date/Instructions:   TBD  *Diagnosis: Right Nephrolithiasis  *Procedure:  Right   Ureteroscopy w/laser lithotripsy & stent exchange (02725)   Additional orders: N/A  -Admit type: OUTpatient  -Anesthesia: General  -VTE Prophylaxis Standing Order SCD's       Other:   -Standing Lab Orders Per Anesthesia    Lab other: UA&Urine Culture- ordered 10/30/22  -Standing Test orders EKG/Chest x-ray per Anesthesia       Test other:   - Medications:  Ceftriaxone(Rocephin) 1gm IV  -Other orders:  N/A

## 2022-11-01 ENCOUNTER — Other Ambulatory Visit: Payer: Self-pay | Admitting: Urology

## 2022-11-03 LAB — CULTURE, URINE COMPREHENSIVE

## 2022-11-04 ENCOUNTER — Encounter
Admission: RE | Admit: 2022-11-04 | Discharge: 2022-11-04 | Disposition: A | Discharge status: Home / Self Care | Payer: Medicare Other | Source: Ambulatory Visit | Attending: Urology | Admitting: Urology

## 2022-11-04 ENCOUNTER — Other Ambulatory Visit: Payer: Self-pay

## 2022-11-04 VITALS — Ht 67.0 in | Wt 190.0 lb

## 2022-11-04 DIAGNOSIS — Z01818 Encounter for other preprocedural examination: Secondary | ICD-10-CM

## 2022-11-04 HISTORY — DX: Personal history of urinary calculi: Z87.442

## 2022-11-04 HISTORY — DX: Anxiety disorder, unspecified: F41.9

## 2022-11-04 NOTE — Patient Instructions (Signed)
Your procedure is scheduled on: 11/05/22 Report to [facility]. To find out your arrival time please call [phone] between 1PM - 3PM on 11/04/22.  Remember: Instructions that are not followed completely may result in serious medical risk, up to and including death, or upon the discretion of your surgeon and anesthesiologist your surgery may need to be rescheduled.     _X__ 1. Do not eat food or drink any liquids after midnight the night before your procedure.                 No gum chewing or hard candies.   __X__2.  On the morning of surgery brush your teeth with toothpaste and water, you                 may rinse your mouth with mouthwash if you wish.  Do not swallow any              toothpaste of mouthwash.     _X__ 3.  No Alcohol for 24 hours before or after surgery.   _X__ 4.  Do Not Smoke or use e-cigarettes For 24 Hours Prior to Your Surgery.                 Do not use any chewable tobacco products for at least 6 hours prior to                 surgery.  ____  5.  Bring all medications with you on the day of surgery if instructed.   __X__  6.  Notify your doctor if there is any change in your medical condition      (cold, fever, infections).     Do not wear jewelry, make-up, hairpins, clips or nail polish. Do not wear lotions, powders, or perfumes. You may wear deodorant. Do not shave body hair 48 hours prior to surgery. Men may shave face and neck. Do not bring valuables to the hospital.    [facility] is not responsible for any belongings or valuables.  Contacts, dentures/partials or body piercings may not be worn into surgery. Bring a case for your contacts, glasses or hearing aids, a denture cup will be supplied. Leave your suitcase in the car. After surgery it may be brought to your room. For patients admitted to the hospital, discharge time is determined by your treatment team.   Patients discharged the day of  surgery will not be allowed to drive home.    __X__ Take these medicines the morning of surgery with A SIP OF WATER:    1. tamsulosin (FLOMAX) 0.4 MG CAPS capsule   2. oxybutynin (DITROPAN) 5 MG tablet   3. oxyCODONE (OXY IR/ROXICODONE) 5 MG immediate release tablet if needed  4.  ____ Fleet Enema (as directed)   ____ Use CHG Soap/SAGE wipes as directed  ____ Use inhalers on the day of surgery  ____ Stop metformin/Janumet/Farxiga 2 days prior to surgery    ____ Take 1/2 of usual insulin dose the night before surgery. No insulin the morning          of surgery.   ____ Stop Blood Thinners Coumadin/Plavix/Xarelto/Pleta/Pradaxa/Eliquis/Effient/Aspirin  on   Or contact your Surgeon, Cardiologist or Medical Doctor regarding  ability to stop your blood thinners  __X__ Stop Anti-inflammatories 7 days before surgery such as Advil, Ibuprofen, Motrin,  BC or Goodies Powder, Naprosyn, Naproxen, Aleve, Aspirin   You may take Tylenol  __X__ Stop all herbals and supplements, fish oil or vitamins  until after surgery.    ____ Bring C-Pap to the hospital.

## 2022-11-05 ENCOUNTER — Ambulatory Visit
Admission: RE | Admit: 2022-11-05 | Discharge: 2022-11-05 | Disposition: A | Discharge status: Home / Self Care | Payer: Medicare Other | Attending: Urology | Admitting: Urology

## 2022-11-05 ENCOUNTER — Other Ambulatory Visit: Payer: Self-pay

## 2022-11-05 ENCOUNTER — Encounter
Admission: RE | Disposition: A | Discharge status: Home / Self Care | Payer: Self-pay | Source: Home / Self Care | Attending: Urology

## 2022-11-05 ENCOUNTER — Ambulatory Visit: Payer: Medicare Other | Admitting: General Practice

## 2022-11-05 ENCOUNTER — Ambulatory Visit: Payer: Medicare Other

## 2022-11-05 ENCOUNTER — Encounter: Payer: Self-pay | Admitting: Urology

## 2022-11-05 DIAGNOSIS — N200 Calculus of kidney: Secondary | ICD-10-CM

## 2022-11-05 DIAGNOSIS — G35 Multiple sclerosis: Secondary | ICD-10-CM | POA: Diagnosis not present

## 2022-11-05 DIAGNOSIS — I10 Essential (primary) hypertension: Secondary | ICD-10-CM | POA: Insufficient documentation

## 2022-11-05 DIAGNOSIS — Z01818 Encounter for other preprocedural examination: Secondary | ICD-10-CM

## 2022-11-05 HISTORY — PX: CYSTOSCOPY/URETEROSCOPY/HOLMIUM LASER/STENT PLACEMENT: SHX6546

## 2022-11-05 LAB — POCT PREGNANCY, URINE: Preg Test, Ur: NEGATIVE

## 2022-11-05 SURGERY — CYSTOSCOPY/URETEROSCOPY/HOLMIUM LASER/STENT PLACEMENT
Anesthesia: General | Site: Ureter | Laterality: Right

## 2022-11-05 MED ORDER — OXYCODONE HCL 5 MG PO TABS
5.0000 mg | ORAL_TABLET | Freq: Once | ORAL | Status: AC | PRN
  Administered 2022-11-05: 5 mg via ORAL

## 2022-11-05 MED ORDER — ACETAMINOPHEN 10 MG/ML IV SOLN
INTRAVENOUS | Status: DC | PRN
  Administered 2022-11-05: 1000 mg via INTRAVENOUS

## 2022-11-05 MED ORDER — ORAL CARE MOUTH RINSE: 15.0000 mL | Freq: Once | OROMUCOSAL | Status: AC

## 2022-11-05 MED ORDER — ONDANSETRON HCL 4 MG/2ML IJ SOLN
INTRAMUSCULAR | Status: DC | PRN
  Administered 2022-11-05: 4 mg via INTRAVENOUS

## 2022-11-05 MED ORDER — FAMOTIDINE 20 MG PO TABS: 20.0000 mg | ORAL_TABLET | Freq: Once | ORAL | Status: AC

## 2022-11-05 MED ORDER — DEXAMETHASONE SODIUM PHOSPHATE 10 MG/ML IJ SOLN
INTRAMUSCULAR | Status: DC | PRN
  Administered 2022-11-05: 8 mg via INTRAVENOUS

## 2022-11-05 MED ORDER — ROCURONIUM BROMIDE 100 MG/10ML IV SOLN
INTRAVENOUS | Status: DC | PRN
  Administered 2022-11-05: 10 mg via INTRAVENOUS
  Administered 2022-11-05: 40 mg via INTRAVENOUS
  Administered 2022-11-05: 5 mg via INTRAVENOUS

## 2022-11-05 MED ORDER — MIDAZOLAM HCL 2 MG/2ML IJ SOLN
INTRAMUSCULAR | Status: AC
  Filled 2022-11-05: qty 2

## 2022-11-05 MED ORDER — FAMOTIDINE 20 MG PO TABS
ORAL_TABLET | ORAL | Status: AC
  Administered 2022-11-05: 20 mg via ORAL
  Filled 2022-11-05: qty 1

## 2022-11-05 MED ORDER — FENTANYL CITRATE (PF) 100 MCG/2ML IJ SOLN
INTRAMUSCULAR | Status: AC
  Filled 2022-11-05: qty 2

## 2022-11-05 MED ORDER — IOHEXOL 180 MG/ML  SOLN
INTRAMUSCULAR | Status: DC | PRN
  Administered 2022-11-05: 10 mL

## 2022-11-05 MED ORDER — PHENYLEPHRINE 80 MCG/ML (10ML) SYRINGE FOR IV PUSH (FOR BLOOD PRESSURE SUPPORT)
PREFILLED_SYRINGE | INTRAVENOUS | Status: AC
  Filled 2022-11-05: qty 10

## 2022-11-05 MED ORDER — OXYCODONE HCL 5 MG/5ML PO SOLN: 5.0000 mg | Freq: Once | ORAL | Status: AC | PRN

## 2022-11-05 MED ORDER — SODIUM CHLORIDE 0.9 % IR SOLN
Status: DC | PRN
  Administered 2022-11-05 (×2): 3000 mL via INTRAVESICAL

## 2022-11-05 MED ORDER — SODIUM CHLORIDE 0.9 % IV SOLN
1.0000 g | INTRAVENOUS | Status: AC
  Administered 2022-11-05: 1 g via INTRAVENOUS
  Filled 2022-11-05: qty 1

## 2022-11-05 MED ORDER — FENTANYL CITRATE (PF) 100 MCG/2ML IJ SOLN
INTRAMUSCULAR | Status: DC | PRN
  Administered 2022-11-05 (×2): 50 ug via INTRAVENOUS

## 2022-11-05 MED ORDER — SODIUM CHLORIDE 0.9 % IV SOLN: INTRAVENOUS | Status: DC

## 2022-11-05 MED ORDER — OXYCODONE HCL 5 MG PO TABS
ORAL_TABLET | ORAL | Status: AC
  Filled 2022-11-05: qty 1

## 2022-11-05 MED ORDER — CHLORHEXIDINE GLUCONATE 0.12 % MT SOLN: 15.0000 mL | Freq: Once | OROMUCOSAL | Status: AC

## 2022-11-05 MED ORDER — PHENYLEPHRINE HCL (PRESSORS) 10 MG/ML IV SOLN
INTRAVENOUS | Status: DC | PRN
  Administered 2022-11-05 (×9): 80 ug via INTRAVENOUS

## 2022-11-05 MED ORDER — PROPOFOL 10 MG/ML IV BOLUS
INTRAVENOUS | Status: DC | PRN
  Administered 2022-11-05: 160 mg via INTRAVENOUS
  Administered 2022-11-05: 40 mg via INTRAVENOUS

## 2022-11-05 MED ORDER — EPHEDRINE SULFATE (PRESSORS) 50 MG/ML IJ SOLN
INTRAMUSCULAR | Status: DC | PRN
  Administered 2022-11-05 (×2): 5 mg via INTRAVENOUS

## 2022-11-05 MED ORDER — FENTANYL CITRATE (PF) 100 MCG/2ML IJ SOLN: 25.0000 ug | INTRAMUSCULAR | Status: DC | PRN

## 2022-11-05 MED ORDER — LIDOCAINE HCL (CARDIAC) PF 100 MG/5ML IV SOSY
PREFILLED_SYRINGE | INTRAVENOUS | Status: DC | PRN
  Administered 2022-11-05: 100 mg via INTRAVENOUS

## 2022-11-05 MED ORDER — SUGAMMADEX SODIUM 200 MG/2ML IV SOLN
INTRAVENOUS | Status: DC | PRN
  Administered 2022-11-05: 172.4 mg via INTRAVENOUS

## 2022-11-05 MED ORDER — ACETAMINOPHEN 10 MG/ML IV SOLN
INTRAVENOUS | Status: AC
  Filled 2022-11-05: qty 100

## 2022-11-05 MED ORDER — CHLORHEXIDINE GLUCONATE 0.12 % MT SOLN
OROMUCOSAL | Status: AC
  Administered 2022-11-05: 15 mL via OROMUCOSAL
  Filled 2022-11-05: qty 15

## 2022-11-05 MED ORDER — MIDAZOLAM HCL 2 MG/2ML IJ SOLN
INTRAMUSCULAR | Status: DC | PRN
  Administered 2022-11-05: 2 mg via INTRAVENOUS

## 2022-11-05 SURGICAL SUPPLY — 28 items
BAG DRAIN SIEMENS DORNER NS (MISCELLANEOUS) ×2 IMPLANT
BAG DRN NS LF (MISCELLANEOUS) ×1
BASKET ZERO TIP 1.9FR (BASKET) IMPLANT
BRUSH SCRUB EZ 1% IODOPHOR (MISCELLANEOUS) ×2 IMPLANT
BSKT STON RTRVL ZERO TP 1.9FR (BASKET) ×1
CATH URET FLEX-TIP 2 LUMEN 10F (CATHETERS) IMPLANT
CATH URETL OPEN END 6X70 (CATHETERS) IMPLANT
CNTNR SPEC 2.5X3XGRAD LEK (MISCELLANEOUS)
CONT SPEC 4OZ STRL OR WHT (MISCELLANEOUS)
CONTAINER SPEC 2.5X3XGRAD LEK (MISCELLANEOUS) IMPLANT
DRAPE UTILITY 15X26 TOWEL STRL (DRAPES) ×2 IMPLANT
FIBER LASER MOSES 200 DFL (Laser) IMPLANT
GLOVE SURG UNDER POLY LF SZ7.5 (GLOVE) ×2 IMPLANT
GOWN STRL REUS W/ TWL LRG LVL3 (GOWN DISPOSABLE) ×2 IMPLANT
GOWN STRL REUS W/ TWL XL LVL3 (GOWN DISPOSABLE) ×2 IMPLANT
GOWN STRL REUS W/TWL LRG LVL3 (GOWN DISPOSABLE) ×1
GOWN STRL REUS W/TWL XL LVL3 (GOWN DISPOSABLE) ×1
GUIDEWIRE STR DUAL SENSOR (WIRE) ×2 IMPLANT
IV NS IRRIG 3000ML ARTHROMATIC (IV SOLUTION) ×2 IMPLANT
KIT TURNOVER CYSTO (KITS) ×2 IMPLANT
PACK CYSTO AR (MISCELLANEOUS) ×2 IMPLANT
SET CYSTO W/LG BORE CLAMP LF (SET/KITS/TRAYS/PACK) ×2 IMPLANT
SHEATH NAVIGATOR HD 12/14X36 (SHEATH) IMPLANT
STENT URET 6FRX24 CONTOUR (STENTS) IMPLANT
STENT URET 6FRX26 CONTOUR (STENTS) IMPLANT
SURGILUBE 2OZ TUBE FLIPTOP (MISCELLANEOUS) ×2 IMPLANT
VALVE UROSEAL ADJ ENDO (VALVE) IMPLANT
WATER STERILE IRR 500ML POUR (IV SOLUTION) ×2 IMPLANT

## 2022-11-05 NOTE — Anesthesia Postprocedure Evaluation (Signed)
Anesthesia Post Note  Patient: Robin Diaz  Procedure(s) Performed: CYSTOSCOPY/URETEROSCOPY/HOLMIUM LASER/STENT EXCHANGE (Right: Ureter)  Patient location during evaluation: PACU Anesthesia Type: General Level of consciousness: awake and alert Pain management: pain level controlled Vital Signs Assessment: post-procedure vital signs reviewed and stable Respiratory status: spontaneous breathing, nonlabored ventilation, respiratory function stable and patient connected to nasal cannula oxygen Cardiovascular status: blood pressure returned to baseline and stable Postop Assessment: no apparent nausea or vomiting Anesthetic complications: no  No notable events documented.   Last Vitals:  Vitals:   11/05/22 1150 11/05/22 1158  BP: 116/80 116/72  Pulse: 74 72  Resp: 13 15  Temp:  36.5 C  SpO2: 99% 99%    Last Pain:  Vitals:   11/05/22 1212  TempSrc:   PainSc: [place]

## 2022-11-05 NOTE — Anesthesia Procedure Notes (Signed)
Procedure Name: Intubation Date/Time: 11/05/2022 9:42 AM  Performed by: Demetrius Charity, CRNAPre-anesthesia Checklist: Patient identified, Patient being monitored, Timeout performed, Emergency Drugs available and Suction available Patient Re-evaluated:Patient Re-evaluated prior to induction Oxygen Delivery Method: Circle system utilized Preoxygenation: Pre-oxygenation with 100% oxygen Induction Type: IV induction Ventilation: Mask ventilation without difficulty Laryngoscope Size: 3 and McGraph Grade View: Grade I Tube type: Oral Tube size: 6.5 mm Number of attempts: 1 Airway Equipment and Method: Stylet Placement Confirmation: ETT inserted through vocal cords under direct vision, positive ETCO2 and breath sounds checked- equal and bilateral Secured at: 22 cm Tube secured with: Tape Dental Injury: Teeth and Oropharynx as per pre-operative assessment

## 2022-11-05 NOTE — Interval H&P Note (Signed)
History and Physical Interval Note:  CV:RRR Lungs:clear  11/05/2022 9:17 AM  Robin Diaz  has presented today for surgery, with the diagnosis of Right Nephrolithiasis.  The various methods of treatment have been discussed with the patient and family. After consideration of risks, benefits and other options for treatment, the patient has consented to  Procedure(s): CYSTOSCOPY/URETEROSCOPY/HOLMIUM LASER/STENT EXCHANGE (Right) as a surgical intervention.  The patient's history has been reviewed, patient examined, no change in status, stable for surgery.  I have reviewed the patient's chart and labs.  Questions were answered to the patient's satisfaction.     [place]

## 2022-11-05 NOTE — Anesthesia Preprocedure Evaluation (Signed)
Anesthesia Evaluation  Patient identified by MRN, date of birth, ID band Patient awake    Reviewed: Allergy & Precautions, NPO status , Patient's Chart, lab work & pertinent test results  History of Anesthesia Complications Negative for: history of anesthetic complications  Airway Mallampati: III  TM Distance: >3 FB Neck ROM: Full    Dental no notable dental hx. (+) Chipped   Pulmonary neg pulmonary ROS, neg COPD   Pulmonary exam normal breath sounds clear to auscultation       Cardiovascular hypertension, (-) angina (-) Past MI negative cardio ROS Normal cardiovascular exam Rhythm:Regular Rate:Normal     Neuro/Psych  Neuromuscular disease (multiple sclerosis) negative neurological ROS  negative psych ROS   GI/Hepatic negative GI ROS, Neg liver ROS,,,  Endo/Other  negative endocrine ROS    Renal/GU negative Renal ROS  negative genitourinary   Musculoskeletal   Abdominal   Peds  Hematology negative hematology ROS (+)   Anesthesia Other Findings PMH of multiple sclerosis   Past Medical History: No date: Anxiety No date: History of kidney stones No date: Hypertension 09/2005: IUFD at 46 weeks or more of gestation     Comment:  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) 2015: Multiple sclerosis ([place]) No date: Rh negative, maternal  Past Surgical History: 02/2002: BREAST REDUCTION SURGERY 10/20/2022: CYSTOSCOPY W/ RETROGRADES; Bilateral     Comment:  Procedure: CYSTOSCOPY WITH RETROGRADE PYELOGRAM;                Surgeon: Abbie Sons, MD;  Location: [facility] ORS;                Service: Urology;  Laterality: Bilateral; 10/20/2022: CYSTOSCOPY WITH STENT PLACEMENT; Bilateral     Comment:  Procedure: CYSTOSCOPY WITH BILATERAL  STENT PLACEMENT;                Surgeon: Abbie Sons, MD;  Location: [facility] ORS;                Service: Urology;  Laterality: Bilateral; No date: INTRAUTERINE DEVICE (IUD)  INSERTION  BMI    Body Mass Index: 29.76 kg/m      Reproductive/Obstetrics negative OB ROS                             Anesthesia Physical Anesthesia Plan  ASA: 2 and emergent  Anesthesia Plan: General   Post-op Pain Management:    Induction: Intravenous  PONV Risk Score and Plan: 3 and Ondansetron, Dexamethasone and Midazolam  Airway Management Planned: Oral ETT  Additional Equipment:   Intra-op Plan:   Post-operative Plan: Extubation in OR  Informed Consent: I have reviewed the patients History and Physical, chart, labs and discussed the procedure including the risks, benefits and alternatives for the proposed anesthesia with the patient or authorized representative who has indicated his/her understanding and acceptance.     Dental advisory given  Plan Discussed with: CRNA  Anesthesia Plan Comments: (Anesthetic considerations for multiple sclerosis: closely monitor body temperature to avoid increase above baseline, avoid succinylcholine, pt may have altered sensitivity to NDMRs.   Patient consented for risks of anesthesia including but not limited to:  - adverse reactions to medications - damage to eyes, teeth, lips or other oral mucosa - nerve damage due to positioning  - sore throat or hoarseness - damage to heart, brain, nerves, lungs, other parts of body or loss of life  Informed patient about role of CRNA  in peri- and intra-operative care.  Patient voiced understanding.)       Anesthesia Quick Evaluation

## 2022-11-05 NOTE — Discharge Instructions (Addendum)
DISCHARGE INSTRUCTIONS FOR KIDNEY STONE/URETERAL STENT   MEDICATIONS:  1. Resume all your other meds from home.  2.  AZO (over-the-counter) can help with the burning/stinging when you urinate. 3.  Continue pain medication as needed Continue oxybutynin and tamsulosin for stent irritation.  If needed the tamsulosin can be taken 1 capsule twice daily  ACTIVITY:  1. May resume regular activities in 24 hours. 2. No driving while on narcotic pain medications  3. Drink plenty of water  4. Continue to walk at home - you can still get blood clots when you are at home, so keep active, but don't over do it.  5. May return to work/school tomorrow or when you feel ready    SIGNS/SYMPTOMS TO CALL:  Common postoperative symptoms include urinary frequency, urgency, bladder spasm and blood in the urine  Please call us if you have a fever greater than 101.5, uncontrolled nausea/vomiting, uncontrolled pain, dizziness, unable to urinate, excessively bloody urine, chest pain, shortness of breath, leg swelling, leg pain, or any other concerns or questions.   You can reach [place] at [phone].   FOLLOW-UP:  1.  Tentative scheduling left ureteroscopy next week    AMBULATORY SURGERY  DISCHARGE INSTRUCTIONS   The drugs that you were given will stay in your system until tomorrow so for the next 24 hours you should not:  Drive an automobile Make any legal decisions Drink any alcoholic beverage   You may resume regular meals tomorrow.  Today it is better to start with liquids and gradually work up to solid foods.  You may eat anything you prefer, but it is better to start with liquids, then soup and crackers, and gradually work up to solid foods.   Please notify your doctor immediately if you have any unusual bleeding, trouble breathing, redness and pain at the surgery site, drainage, fever, or pain not relieved by medication.    Additional Instructions:   Please contact your physician with any  problems or Same Day Surgery at [phone], Monday through Friday 6 am to 4 pm, or [place] at [facility] number at [phone].

## 2022-11-05 NOTE — Transfer of Care (Signed)
Immediate Anesthesia Transfer of Care Note  Patient: Robin Diaz  Procedure(s) Performed: CYSTOSCOPY/URETEROSCOPY/HOLMIUM LASER/STENT EXCHANGE (Right: Ureter)  Patient Location: PACU  Anesthesia Type:General  Level of Consciousness: awake and alert   Airway & Oxygen Therapy: Patient Spontanous Breathing and Patient connected to face mask oxygen  Post-op Assessment: Report given to RN and Post -op Vital signs reviewed and stable  Post vital signs: Reviewed and stable  Last Vitals:  Vitals Value Taken Time  BP    Temp    Pulse 92 11/05/22 1120  Resp    SpO2 100 % 11/05/22 1120  Vitals shown include unvalidated device data.  Last Pain:  Vitals:   11/05/22 0848  TempSrc: Oral  PainSc: 0-No pain         Complications: No notable events documented.

## 2022-11-05 NOTE — Op Note (Signed)
Preoperative diagnosis: Right nephrolithiasis   Postoperative diagnosis: Right nephrolithiasis  Procedure:  Cystoscopy Right ureteroscopy and stone removal Ureteroscopic laser lithotripsy Right ureteral stent exchange (432F/24 cm)  Right retrograde pyelography with interpretation Intraoperative fluoroscopy < 30 minutes  Surgeon: Nicki Reaper C. Paizley Ramella, M.D.  Anesthesia: General  Complications: None  Intraoperative findings:  Ureteropyeloscopy-large right renal pelvic calculus with inflammatory mucosa at the UPJ; 5 mm right lower calyceal calculus.  Ureter normal in appearance Right retrograde pyelogram post procedure showed no contrast extravasation or significantly sized stone fragments  EBL: Minimal  Specimens: Calculus fragments for analysis  Indication: Robin Diaz is a 42 y.o. female who underwent bilateral ureteral stent placement for >2 cm bilateral renal pelvic calculi with obstruction/infection on 10/20/2022.  She opted for ureteroscopic removal and lieu of PCNL and understands the likelihood of a staged procedure.  The right renal pelvic calculus measures 2.3 cm.  After reviewing the management options for treatment, the patient elected to proceed with the above surgical procedure(s). We have discussed the potential benefits and risks of the procedure, side effects of the proposed treatment, the likelihood of the patient achieving the goals of the procedure, and any potential problems that might occur during the procedure or recuperation. Informed consent has been obtained.  Description of procedure:  The patient was taken to the operating room and general anesthesia was induced.  The patient was placed in the dorsal lithotomy position, prepped and draped in the usual sterile fashion, and preoperative antibiotics were administered. A preoperative time-out was performed.   A 21 French cystoscope was lubricated and passed under direct vision.  Panendoscopy was performed  and the bladder mucosa showed no erythema, solid or papillary lesions with the exception of inflammatory changes bilateral UOs secondary to indwelling stent.  Attention was directed to the right ureteral orifice and the stent was grasped with endoscopic forceps and brought out through the urethral meatus.  A 0.038 Sensor wire was then placed the stent and advanced into an upper pole calyx. The cystoscope was removed and a dual-lumen catheter was placed over the Sensor wire and a second Sensor wire was placed in a similar fashion.  A 12/14 French ureteral access sheath was placed over the working wire under fluoroscopic guidance was advanced to the UPJ without difficulty.  The inner stylette and working wire removed.  A dual channel digital flexible ureteroscope was placed through the access sheath and advanced into the renal pelvis without difficulty.  Pyeloscopy was performed with findings as described above.    A 200 m Moses holmium laser fiber was placed through the ureteroscope and the stone was dusted at a setting of 0.3J/120 Hz.   The stone was completely dusted/fragmented and 1.25 hours.  Several larger fragments that chipped off the main stone during dusting were further treated with noncontact laser lithotripsy.  2 larger fragments were placed and a 1.9 [place] nitinol basket, removed and sent for analysis.  Retrograde pyelogram was performed with findings as described above.  The ureteral access sheath and ureteroscope were removed in tandem and the ureter showed no evidence of injury or perforation.  A 432F/24 cm Contour ureteral stent was placed under fluoroscopic guidance. The proximal tip was within a lower pole calyx and the distal end was well-positioned in the bladder.  The bladder was then emptied and the procedure ended.  The patient appeared to tolerate the procedure well and without complications.  After anesthetic reversal she was transported to the PACU in stable condition.  Plan: Treatment of her left renal pelvic calculus tentatively scheduled in 1 week Will plan right ureteral stent removal and second look right ureteroscopy at that time   [name], MD

## 2022-11-06 ENCOUNTER — Ambulatory Visit: Payer: Self-pay | Admitting: Urology

## 2022-11-06 ENCOUNTER — Other Ambulatory Visit: Payer: Self-pay | Admitting: Urology

## 2022-11-06 DIAGNOSIS — N1330 Unspecified hydronephrosis: Secondary | ICD-10-CM

## 2022-11-06 NOTE — Progress Notes (Unsigned)
Surgical Physician Order Form [facility]  * Scheduling expectation : Next Available  *Length of Case: 2.5 hours  *Clearance needed: no  *Anticoagulation Instructions: N/A  *Aspirin Instructions: N/A  *Post-op visit Date/Instructions:   TBD  *Diagnosis:  Bilateral nephrolithiasis  *Procedure: Left Ureteroscopy w/laser lithotripsy & stent placement (53794); right ureteral stent removal; right ureteroscopy with possible stone removal   Additional orders: N/A  -Admit type: OUTpatient  -Anesthesia: General  -VTE Prophylaxis Standing Order SCD's       Other:   -Standing Lab Orders Per Anesthesia    Lab other: None  -Standing Test orders EKG/Chest x-ray per Anesthesia       Test other:   - Medications:  Ceftriaxone(Rocephin) 1gm IV  -Other orders:  N/A

## 2022-11-07 ENCOUNTER — Other Ambulatory Visit: Payer: Self-pay | Admitting: Urology

## 2022-11-07 ENCOUNTER — Telehealth: Payer: Self-pay

## 2022-11-07 NOTE — Addendum Note (Signed)
Addended by: Gerald Leitz A on: 11/07/2022 01:29 PM   Modules accepted: Orders

## 2022-11-07 NOTE — Telephone Encounter (Signed)
I spoke with Robin Diaz. We have discussed possible surgery dates and Tuesday January 16th, 2024 was agreed upon by all parties. Patient given information about surgery date, what to expect pre-operatively and post operatively.  We discussed that a Pre-Admission Testing office will be calling to set up the pre-op visit that will take place prior to surgery, and that these appointments are typically done over the phone with a Pre-Admissions RN. Informed patient that our office will communicate any additional care to be provided after surgery. Patients questions or concerns were discussed during our call. Advised to call our office should there be any additional information, questions or concerns that arise. Patient verbalized understanding.

## 2022-11-07 NOTE — Progress Notes (Addendum)
   [facility] From  Surgery Date: Date: 11/12/2022  Surgeon: Dr. John Giovanni, MD  Inpt ( No  )   Outpt (Yes)   Obs ( No  )   Diagnosis: N20.0 Bilateral Nephrolithiasis  -CPT: 52356, 52353, 52310  Surgery:Left Ureteroscopy with laser lithotripsy and stent placement, Right Ureteral Stent Placement, Right Ureteroscopy with possible Stone Removal  Stop Anticoagulations: N/A  Cardiac/Medical/Pulmonary Clearance needed: no  *Orders entered into EPIC  Date: 11/07/22   *Case booked in [place]  Date: 11/07/22  *Notified pt of Surgery: Date: 11/07/22  PRE-OP UA & CX: no  *Placed into Prior Authorization Work Que Date: 11/07/22  Assistant/laser/rep:No

## 2022-11-11 ENCOUNTER — Encounter
Admission: RE | Admit: 2022-11-11 | Discharge: 2022-11-11 | Disposition: A | Discharge status: Home / Self Care | Payer: Medicare Other | Source: Ambulatory Visit | Attending: Urology | Admitting: Urology

## 2022-11-11 DIAGNOSIS — Z01818 Encounter for other preprocedural examination: Secondary | ICD-10-CM

## 2022-11-11 HISTORY — DX: Anemia, unspecified: D64.9

## 2022-11-11 MED ORDER — SODIUM CHLORIDE 0.9 % IV SOLN: INTRAVENOUS | Status: DC

## 2022-11-11 MED ORDER — CHLORHEXIDINE GLUCONATE 0.12 % MT SOLN: 15.0000 mL | Freq: Once | OROMUCOSAL | Status: AC

## 2022-11-11 MED ORDER — ORAL CARE MOUTH RINSE: 15.0000 mL | Freq: Once | OROMUCOSAL | Status: AC

## 2022-11-11 MED ORDER — SODIUM CHLORIDE 0.9 % IV SOLN
1.0000 g | INTRAVENOUS | Status: AC
  Administered 2022-11-12: 1 g via INTRAVENOUS
  Filled 2022-11-11: qty 1

## 2022-11-11 MED ORDER — FAMOTIDINE 20 MG PO TABS: 20.0000 mg | ORAL_TABLET | Freq: Once | ORAL | Status: AC

## 2022-11-11 NOTE — Patient Instructions (Addendum)
Your procedure is scheduled on:11-12-22 Tuesday Report to the Registration Desk on the 1st floor of the [place].Then proceed to the 2nd floor Surgery Desk To find out your arrival time, please call [phone] between 1PM - 3PM on:11-11-22 Monday If your arrival time is 6:00 am, do not arrive prior to that time as the [facility] entrance doors do not open until 6:00 am.  REMEMBER: Instructions that are not followed completely may result in serious medical risk, up to and including death; or upon the discretion of your surgeon and anesthesiologist your surgery may need to be rescheduled.  Do not eat food OR drink any liquids after midnight the night before surgery.  No gum chewing, lozengers or hard candies.  TAKE THESE MEDICATIONS THE MORNING OF SURGERY WITH A SIP OF WATER: -tamsulosin (FLOMAX)   One week prior to surgery: Stop Anti-inflammatories (NSAIDS) such as Advil, Aleve, Ibuprofen, Motrin, Naproxen, Naprosyn and Aspirin based products such as Excedrin, Goodys Powder, BC Powder. You may however, take Tylenol if needed for pain up until the day of surgery.  No Alcohol for 24 hours before or after surgery.  No Smoking including e-cigarettes for 24 hours prior to surgery.  No chewable tobacco products for at least 6 hours prior to surgery.  No nicotine patches on the day of surgery.  Do not use any "recreational" drugs for at least a week prior to your surgery.  Please be advised that the combination of cocaine and anesthesia may have negative outcomes, up to and including death. If you test positive for cocaine, your surgery will be cancelled.  On the morning of surgery brush your teeth with toothpaste and water, you may rinse your mouth with mouthwash if you wish. Do not swallow any toothpaste or mouthwash.  Do not wear jewelry, make-up, hairpins, clips or nail polish.  Do not wear lotions, powders, or perfumes.   Do not shave body from the neck down 48 hours prior to  surgery just in case you cut yourself which could leave a site for infection.  Also, freshly shaved skin may become irritated if using the CHG soap.  Contact lenses, hearing aids and dentures may not be worn into surgery.  Do not bring valuables to the hospital. [facility] is not responsible for any missing/lost belongings or valuables.   Notify your doctor if there is any change in your medical condition (cold, fever, infection).  Wear comfortable clothing (specific to your surgery type) to the hospital.  After surgery, you can help prevent lung complications by doing breathing exercises.  Take deep breaths and cough every 1-2 hours. Your doctor may order a device called an Incentive Spirometer to help you take deep breaths. When coughing or sneezing, hold a pillow firmly against your incision with both hands. This is called "splinting." Doing this helps protect your incision. It also decreases belly discomfort.  If you are being admitted to the hospital overnight, leave your suitcase in the car. After surgery it may be brought to your room.  If you are being discharged the day of surgery, you will not be allowed to drive home. You will need a responsible adult (18 years or older) to drive you home and stay with you that night.   If you are taking public transportation, you will need to have a responsible adult (18 years or older) with you. Please confirm with your physician that it is acceptable to use public transportation.   Please call the [facility]  at [phone] if you have any questions about these instructions.  Surgery Visitation Policy:  Patients undergoing a surgery or procedure may have two family members or support persons with them as long as the person is not COVID-19 positive or experiencing its symptoms.   Due to an increase in RSV and influenza rates and associated hospitalizations, children ages 31 and under will not be able to visit patients  in [facility]. Masks continue to be strongly recommended.

## 2022-11-12 ENCOUNTER — Ambulatory Visit: Payer: Medicare Other

## 2022-11-12 ENCOUNTER — Encounter: Payer: Self-pay | Admitting: Urology

## 2022-11-12 ENCOUNTER — Ambulatory Visit: Payer: Medicare Other | Admitting: Anesthesiology

## 2022-11-12 ENCOUNTER — Encounter
Admission: RE | Disposition: A | Discharge status: Home / Self Care | Payer: Self-pay | Source: Ambulatory Visit | Attending: Urology

## 2022-11-12 ENCOUNTER — Ambulatory Visit
Admission: RE | Admit: 2022-11-12 | Discharge: 2022-11-12 | Disposition: A | Discharge status: Home / Self Care | Payer: Medicare Other | Source: Ambulatory Visit | Attending: Urology | Admitting: Urology

## 2022-11-12 DIAGNOSIS — N132 Hydronephrosis with renal and ureteral calculous obstruction: Secondary | ICD-10-CM | POA: Diagnosis not present

## 2022-11-12 DIAGNOSIS — Z01818 Encounter for other preprocedural examination: Secondary | ICD-10-CM

## 2022-11-12 DIAGNOSIS — N200 Calculus of kidney: Secondary | ICD-10-CM

## 2022-11-12 DIAGNOSIS — I10 Essential (primary) hypertension: Secondary | ICD-10-CM | POA: Diagnosis not present

## 2022-11-12 DIAGNOSIS — N1330 Unspecified hydronephrosis: Secondary | ICD-10-CM

## 2022-11-12 DIAGNOSIS — G35 Multiple sclerosis: Secondary | ICD-10-CM | POA: Diagnosis not present

## 2022-11-12 HISTORY — PX: CYSTOSCOPY W/ URETERAL STENT REMOVAL: SHX1430

## 2022-11-12 HISTORY — PX: CYSTOSCOPY/URETEROSCOPY/HOLMIUM LASER: SHX6545

## 2022-11-12 HISTORY — PX: CYSTOSCOPY/URETEROSCOPY/HOLMIUM LASER/STENT PLACEMENT: SHX6546

## 2022-11-12 LAB — CALCULI, WITH PHOTOGRAPH (CLINICAL LAB)
Calcium Oxalate Dihydrate: 70 %
Calcium Oxalate Monohydrate: 20 %
Hydroxyapatite: 10 %
Weight Calculi: 4 mg

## 2022-11-12 SURGERY — CYSTOSCOPY/URETEROSCOPY/HOLMIUM LASER/STENT PLACEMENT
Anesthesia: General | Laterality: Right

## 2022-11-12 MED ORDER — PROMETHAZINE HCL 25 MG/ML IJ SOLN: 6.2500 mg | INTRAMUSCULAR | Status: DC | PRN

## 2022-11-12 MED ORDER — KETAMINE HCL 50 MG/5ML IJ SOSY
PREFILLED_SYRINGE | INTRAMUSCULAR | Status: AC
  Filled 2022-11-12: qty 5

## 2022-11-12 MED ORDER — FENTANYL CITRATE (PF) 100 MCG/2ML IJ SOLN
INTRAMUSCULAR | Status: AC
  Filled 2022-11-12: qty 2

## 2022-11-12 MED ORDER — KETAMINE HCL 10 MG/ML IJ SOLN
INTRAMUSCULAR | Status: DC | PRN
  Administered 2022-11-12: 20 mg via INTRAVENOUS

## 2022-11-12 MED ORDER — OXYCODONE HCL 5 MG PO TABS
ORAL_TABLET | ORAL | Status: AC
  Administered 2022-11-12: 5 mg via ORAL
  Filled 2022-11-12: qty 1

## 2022-11-12 MED ORDER — ONDANSETRON HCL 4 MG/2ML IJ SOLN
INTRAMUSCULAR | Status: DC | PRN
  Administered 2022-11-12: 4 mg via INTRAVENOUS

## 2022-11-12 MED ORDER — FENTANYL CITRATE (PF) 100 MCG/2ML IJ SOLN
25.0000 ug | INTRAMUSCULAR | Status: DC | PRN
  Administered 2022-11-12: 50 ug via INTRAVENOUS

## 2022-11-12 MED ORDER — ACETAMINOPHEN 10 MG/ML IV SOLN: 1000.0000 mg | Freq: Once | INTRAVENOUS | Status: DC | PRN

## 2022-11-12 MED ORDER — ACETAMINOPHEN 10 MG/ML IV SOLN
INTRAVENOUS | Status: DC | PRN
  Administered 2022-11-12: 1000 mg via INTRAVENOUS

## 2022-11-12 MED ORDER — SODIUM CHLORIDE 0.9 % IR SOLN
Status: DC | PRN
  Administered 2022-11-12 (×3): 3000 mL via INTRAVESICAL

## 2022-11-12 MED ORDER — KETOROLAC TROMETHAMINE 30 MG/ML IJ SOLN
INTRAMUSCULAR | Status: DC | PRN
  Administered 2022-11-12: 30 mg via INTRAVENOUS

## 2022-11-12 MED ORDER — ACETAMINOPHEN 10 MG/ML IV SOLN
INTRAVENOUS | Status: AC
  Filled 2022-11-12: qty 100

## 2022-11-12 MED ORDER — IOHEXOL 180 MG/ML  SOLN
INTRAMUSCULAR | Status: DC | PRN
  Administered 2022-11-12: 10 mL
  Administered 2022-11-12: 20 mL

## 2022-11-12 MED ORDER — MIDAZOLAM HCL 2 MG/2ML IJ SOLN
INTRAMUSCULAR | Status: AC
  Filled 2022-11-12: qty 2

## 2022-11-12 MED ORDER — DEXAMETHASONE SODIUM PHOSPHATE 10 MG/ML IJ SOLN
INTRAMUSCULAR | Status: DC | PRN
  Administered 2022-11-12: 10 mg via INTRAVENOUS

## 2022-11-12 MED ORDER — OXYCODONE HCL 5 MG PO TABS
5.0000 mg | ORAL_TABLET | Freq: Once | ORAL | Status: AC | PRN
  Administered 2022-11-12: 5 mg via ORAL

## 2022-11-12 MED ORDER — DROPERIDOL 2.5 MG/ML IJ SOLN: 0.6250 mg | Freq: Once | INTRAMUSCULAR | Status: DC | PRN

## 2022-11-12 MED ORDER — LIDOCAINE HCL (CARDIAC) PF 100 MG/5ML IV SOSY
PREFILLED_SYRINGE | INTRAVENOUS | Status: DC | PRN
  Administered 2022-11-12: 100 mg via INTRAVENOUS

## 2022-11-12 MED ORDER — OXYCODONE HCL 5 MG PO TABS
ORAL_TABLET | ORAL | Status: AC
  Filled 2022-11-12: qty 1

## 2022-11-12 MED ORDER — SUGAMMADEX SODIUM 200 MG/2ML IV SOLN
INTRAVENOUS | Status: DC | PRN
  Administered 2022-11-12: 200 mg via INTRAVENOUS

## 2022-11-12 MED ORDER — FAMOTIDINE 20 MG PO TABS
ORAL_TABLET | ORAL | Status: AC
  Administered 2022-11-12: 20 mg via ORAL
  Filled 2022-11-12: qty 1

## 2022-11-12 MED ORDER — ROCURONIUM BROMIDE 100 MG/10ML IV SOLN
INTRAVENOUS | Status: DC | PRN
  Administered 2022-11-12: 30 mg via INTRAVENOUS
  Administered 2022-11-12: 20 mg via INTRAVENOUS
  Administered 2022-11-12: 10 mg via INTRAVENOUS
  Administered 2022-11-12: 40 mg via INTRAVENOUS

## 2022-11-12 MED ORDER — MIDAZOLAM HCL 2 MG/2ML IJ SOLN
INTRAMUSCULAR | Status: DC | PRN
  Administered 2022-11-12: 1 mg via INTRAVENOUS

## 2022-11-12 MED ORDER — OXYCODONE HCL 5 MG/5ML PO SOLN: 5.0000 mg | Freq: Once | ORAL | Status: AC | PRN

## 2022-11-12 MED ORDER — PHENYLEPHRINE HCL (PRESSORS) 10 MG/ML IV SOLN
INTRAVENOUS | Status: DC | PRN
  Administered 2022-11-12 (×3): 40 ug via INTRAVENOUS
  Administered 2022-11-12 (×2): 80 ug via INTRAVENOUS
  Administered 2022-11-12 (×3): 40 ug via INTRAVENOUS
  Administered 2022-11-12 (×2): 80 ug via INTRAVENOUS

## 2022-11-12 MED ORDER — CHLORHEXIDINE GLUCONATE 0.12 % MT SOLN
OROMUCOSAL | Status: AC
  Administered 2022-11-12: 15 mL via OROMUCOSAL
  Filled 2022-11-12: qty 15

## 2022-11-12 MED ORDER — PROPOFOL 10 MG/ML IV BOLUS
INTRAVENOUS | Status: DC | PRN
  Administered 2022-11-12: 200 mg via INTRAVENOUS

## 2022-11-12 MED ORDER — ROCURONIUM BROMIDE 10 MG/ML (PF) SYRINGE
PREFILLED_SYRINGE | INTRAVENOUS | Status: AC
  Filled 2022-11-12: qty 10

## 2022-11-12 MED ORDER — FENTANYL CITRATE (PF) 100 MCG/2ML IJ SOLN
INTRAMUSCULAR | Status: DC | PRN
  Administered 2022-11-12: 100 ug via INTRAVENOUS

## 2022-11-12 MED ORDER — KETOROLAC TROMETHAMINE 30 MG/ML IJ SOLN
INTRAMUSCULAR | Status: AC
  Filled 2022-11-12: qty 1

## 2022-11-12 SURGICAL SUPPLY — 29 items
BAG DRAIN SIEMENS DORNER NS (MISCELLANEOUS) ×3 IMPLANT
BAG DRN NS LF (MISCELLANEOUS) ×2
BASKET ZERO TIP 1.9FR (BASKET) IMPLANT
BRUSH SCRUB EZ 1% IODOPHOR (MISCELLANEOUS) ×3 IMPLANT
BSKT STON RTRVL ZERO TP 1.9FR (BASKET) ×2
CATH URET FLEX-TIP 2 LUMEN 10F (CATHETERS) IMPLANT
CATH URETL OPEN END 6X70 (CATHETERS) IMPLANT
CNTNR URN SCR LID CUP LEK RST (MISCELLANEOUS) IMPLANT
CONT SPEC 4OZ STRL OR WHT (MISCELLANEOUS)
DRAPE UTILITY 15X26 TOWEL STRL (DRAPES) ×3 IMPLANT
FIBER LASER MOSES 200 DFL (Laser) IMPLANT
GAUZE 4X4 16PLY ~~LOC~~+RFID DBL (SPONGE) ×6 IMPLANT
GLOVE SURG UNDER POLY LF SZ7.5 (GLOVE) ×3 IMPLANT
GOWN STRL REUS W/ TWL LRG LVL3 (GOWN DISPOSABLE) ×6 IMPLANT
GOWN STRL REUS W/ TWL XL LVL3 (GOWN DISPOSABLE) ×3 IMPLANT
GOWN STRL REUS W/TWL LRG LVL3 (GOWN DISPOSABLE) ×4
GOWN STRL REUS W/TWL XL LVL3 (GOWN DISPOSABLE) ×2
GUIDEWIRE STR DUAL SENSOR (WIRE) ×3 IMPLANT
IV NS IRRIG 3000ML ARTHROMATIC (IV SOLUTION) ×3 IMPLANT
KIT TURNOVER CYSTO (KITS) ×3 IMPLANT
PACK CYSTO AR (MISCELLANEOUS) ×3 IMPLANT
SET CYSTO W/LG BORE CLAMP LF (SET/KITS/TRAYS/PACK) ×3 IMPLANT
SHEATH NAVIGATOR HD 12/14X36 (SHEATH) IMPLANT
STENT URET 6FRX24 CONTOUR (STENTS) IMPLANT
STENT URET 6FRX26 CONTOUR (STENTS) IMPLANT
SURGILUBE 2OZ TUBE FLIPTOP (MISCELLANEOUS) ×3 IMPLANT
TRAP FLUID SMOKE EVACUATOR (MISCELLANEOUS) ×3 IMPLANT
VALVE UROSEAL ADJ ENDO (VALVE) IMPLANT
WATER STERILE IRR 500ML POUR (IV SOLUTION) ×3 IMPLANT

## 2022-11-12 NOTE — Interval H&P Note (Signed)
History and Physical Interval Note:  Status post right ureteroscopy with laser lithotripsy last week.  She presents today for treatment of her 2+ cm left renal pelvic calculus.  In addition we will plan removal of a right ureteral stent and second look to evaluate for significant residual fragments.  All questions were answered and she desires to proceed  11/12/2022 11:30 AM  Robin Diaz  has presented today for surgery, with the diagnosis of Bilateral Nephrolithiasis.  The various methods of treatment have been discussed with the patient and family. After consideration of risks, benefits and other options for treatment, the patient has consented to  Procedure(s): CYSTOSCOPY/URETEROSCOPY/HOLMIUM LASER/STENT PLACEMENT (Left) CYSTOSCOPY WITH STENT REMOVAL (Right) CYSTOSCOPY/URETEROSCOPY/HOLMIUM LASER (Right) as a surgical intervention.  The patient's history has been reviewed, patient examined, no change in status, stable for surgery.  I have reviewed the patient's chart and labs.  Questions were answered to the patient's satisfaction.     Joppa

## 2022-11-12 NOTE — Op Note (Signed)
Preoperative diagnosis:  Left nephrolithiasis Right nephrolithiasis  Postoperative diagnosis:  Same  Procedure: Right ureteroscopy with laser lithotripsy/stone removal Right ureteral stent exchange Left ureteroscopy with laser lithotripsy/stone removal Left ureteral stent exchange Bilateral retrograde pyelograms with interpretation Intraoperative fluoroscopy < 30 minutes  Surgeon: Abbie Sons, MD  Anesthesia: General  Complications: None  Intraoperative findings:  Right ureteropyeloscopy-multiple ureteral fragments all less than 1 mm in size.  Right lower calyceal fragment measuring ~ 5 mm Right retrograde pyelogram postprocedure showed no filling defects or contrast extravasation Left ureteropyeloscopy-large renal pelvic calculus.  Ureter normal in appearance Left retrograde pyelogram post procedure showed no contrast extravasation  EBL: Minimal  Specimens: None  Indication: Robin Diaz is a 42 y.o. female who underwent bilateral ureteral stent placement for >2 cm bilateral renal pelvic calculi with obstruction/infection on 10/20/2022.  She underwent right ureteroscopy with laser lithotripsy/stone removal 11/05/2022.  She presents today for second look right ureteroscopy due to previous stone burden and left ureteroscopy with laser lithotripsy.  After reviewing the management options for treatment, she elected to proceed with the above surgical procedure(s). We have discussed the potential benefits and risks of the procedure, side effects of the proposed treatment, the likelihood of the patient achieving the goals of the procedure, and any potential problems that might occur during the procedure or recuperation. Informed consent has been obtained.  Description of procedure:  The patient was taken to the operating room and general anesthesia was induced.  The patient was placed in the dorsal lithotomy position, prepped and draped in the usual sterile fashion, and  preoperative antibiotics were administered. A preoperative time-out was performed.   A 21 French cystoscope was lubricated and passed per urethra.  The right ureteral stent was grasped with endoscopic forceps and brought out through the urethral meatus.  The cystoscope was repassed and the left ureteral stent was grasped with endoscopic forceps and brought out through the urethral meatus.  A 0.038 Sensor wire was placed through the right ureteral stent and advanced into the renal pelvis.  There were was an apparent 5 mm fragment overlying the right renal outline on fluoroscopy.  The ureteral stent was removed and a dual channel flexible digital ureteroscope was passed per urethra.  The right UO was easily cannulated with the scope alongside the guidewire.  Multiple small fragments were seen in the ureter and multiple fragments were removed with a 1.9 [place] nitinol basket to allow passage of the ureteroscope into the renal pelvis.  Once this was achieved the flexible scope was advanced into the renal pelvis.  Several small fragments remained in the mid and lower pole calyces.  Although no fragments estimated greater than 1 mm in size identified noncontact laser lithotripsy was performed at a setting of 0.6J/40 Hz.  The right calyceal calculus was identified and dusted with a 200 m holmium laser fiber at a setting of 0.3J/80 Hz.  Retrograde pyelogram was performed with findings as described above.  A 25F/26 cm contour ureteral stent was placed on fluoroscopic guidance.  There was good curl identified in the renal pelvis and bladder under fluoroscopy.  The stent was left attached to a tether.  The left ureteral stent was then cannulated with a 0.038 Sensor wire and advanced to an upper pole calyx.  A dual-lumen catheter was then placed over the wire without difficulty.  A second guidewire was placed and the dual-lumen catheter was removed.  A 12/14 ureteral access sheath was then advanced over the working  wire without difficulty and positioned at the UPJ.  The inner stylette and guidewire were removed.  The flexible ureteroscope was then placed through the access sheath and advanced into the renal pelvis.  The renal pelvic calculus was dusted at a setting of 0.3J/120 Hz with a 200 m holmium laser fiber.  Once reduced in size the power was decreased to 0.3J/80 Hz.  It took approximately 60 minutes to dust the calculus.  The calyces were examined and several fragments that chipped off the main stone larger than 1 mm size and were further treated with noncontact laser lithotripsy at a setting of 0.6J/40 Hz.  Retrograde pyelogram was then performed with findings as described above.  All calyces were examined and although there was significant dust burden present significantly sized fragments were not identified under direct vision or on fluoroscopy.  A 50F/26 cm contour ureteral stent was then placed on fluoroscopic guidance with good positioning noted proximally and distally.  The bladder was emptied with a cystoscope sheath.  After anesthetic reversal she was transported to the PACU in stable condition.  Plan: The right ureteral stent was left attached to a tether and the patient will remove it on Thursday, 11/14/2022 A KUB will be obtained in 1 week to determine remaining stone burden on the left side and timing of stent removal   Abbie Sons, M.D.

## 2022-11-12 NOTE — Discharge Instructions (Addendum)
AMBULATORY SURGERY  DISCHARGE INSTRUCTIONS   The drugs that you were given will stay in your system until tomorrow so for the next 24 hours you should not:  Drive an automobile Make any legal decisions Drink any alcoholic beverage   You may resume regular meals tomorrow.  Today it is better to start with liquids and gradually work up to solid foods.  You may eat anything you prefer, but it is better to start with liquids, then soup and crackers, and gradually work up to solid foods.   Please notify your doctor immediately if you have any unusual bleeding, trouble breathing, redness and pain at the surgery site, drainage, fever, or pain not relieved by medication.    Your post-operative visit with Dr.                                       is: Date:                        Time:    Please call to schedule your post-operative visit.  Additional Instructions:  DISCHARGE INSTRUCTIONS FOR KIDNEY STONE/URETERAL STENT   MEDICATIONS:  1. Resume all your other meds from home.  2.  AZO (over-the-counter) can help with the burning/stinging when you urinate.   ACTIVITY:  1. May resume regular activities in 24 hours. 2. No driving while on narcotic pain medications  3. Drink plenty of water  4. Continue to walk at home - you can still get blood clots when you are at home, so keep active, but don't over do it.  5. May return to work/school tomorrow or when you feel ready   BATHING:  1. You can shower. 2. You have a string coming from your urethra: The stent string is attached to your ureteral stent. Do not pull on this.   SIGNS/SYMPTOMS TO CALL:  Common postoperative symptoms include urinary frequency, urgency, bladder spasm and blood in the urine  Please call us if you have a fever greater than 101.5, uncontrolled nausea/vomiting, uncontrolled pain, dizziness, unable to urinate, excessively bloody urine, chest pain, shortness of breath, leg swelling, leg pain, or any other concerns  or questions.   You can reach [place] at [phone].   FOLLOW-UP:  1. You have a string attached to your stent, you may remove it on Thursday, 11/14/2022. To do this, pull the string until the stent is completely removed. You may feel an odd sensation in your back.  If you would prefer to have the stent removed in office please call and a time will be arranged for you to come in to have the stent removed. 2.  Follow-up abdominal x-ray 1 week to determine timing of left ureteral stent removal

## 2022-11-12 NOTE — Anesthesia Preprocedure Evaluation (Addendum)
Anesthesia Evaluation  Patient identified by MRN, date of birth, ID band Patient awake    Reviewed: Allergy & Precautions, NPO status , Patient's Chart, lab work & pertinent test results  History of Anesthesia Complications Negative for: history of anesthetic complications  Airway Mallampati: II  TM Distance: >3 FB Neck ROM: Full    Dental no notable dental hx.    Pulmonary neg pulmonary ROS, neg COPD   Pulmonary exam normal breath sounds clear to auscultation       Cardiovascular hypertension, Pt. 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) 2015: Multiple sclerosis ([place]) No date: Rh negative, maternal  Past Surgical History: 02/2002: BREAST REDUCTION SURGERY 10/20/2022: CYSTOSCOPY W/ RETROGRADES; Bilateral     Comment:  Procedure: CYSTOSCOPY WITH RETROGRADE PYELOGRAM;                Surgeon: Abbie Sons, MD;  Location: [facility] ORS;                Service: Urology;  Laterality: Bilateral; 10/20/2022: CYSTOSCOPY WITH STENT PLACEMENT; Bilateral     Comment:  Procedure: CYSTOSCOPY WITH BILATERAL  STENT PLACEMENT;                Surgeon: Abbie Sons, MD;  Location: [facility] ORS;                Service: Urology;  Laterality: Bilateral; No date: INTRAUTERINE DEVICE (IUD) INSERTION  BMI    Body Mass  Index: 29.76 kg/m      Reproductive/Obstetrics negative OB ROS                             Anesthesia Physical Anesthesia Plan  ASA: 2  Anesthesia Plan: General   Post-op Pain Management:    Induction: Intravenous  PONV Risk Score and Plan: 3 and Ondansetron, Dexamethasone and Midazolam  Airway Management Planned: Oral ETT  Additional Equipment:   Intra-op Plan:   Post-operative Plan: Extubation in OR  Informed Consent: I have reviewed the patients History and Physical, chart, labs and discussed the procedure including the risks, benefits and alternatives for the proposed anesthesia with the patient or authorized representative who has indicated his/her understanding and acceptance.     Dental advisory given  Plan Discussed with: CRNA  Anesthesia Plan Comments: (Anesthetic considerations for multiple sclerosis: closely monitor body temperature to avoid increase above baseline, avoid succinylcholine, pt may have altered sensitivity to NDMRs. )       Anesthesia Quick Evaluation

## 2022-11-12 NOTE — Transfer of Care (Signed)
Immediate Anesthesia Transfer of Care Note  Patient: Robin Diaz  Procedure(s) Performed: CYSTOSCOPY/URETEROSCOPY/HOLMIUM LASER/STENT PLACEMENT (Left) CYSTOSCOPY WITH STENT REMOVAL (Right) CYSTOSCOPY/URETEROSCOPY/HOLMIUM LASER (Right)  Patient Location: PACU  Anesthesia Type:General  Level of Consciousness: drowsy and patient cooperative  Airway & Oxygen Therapy: Patient Spontanous Breathing  Post-op Assessment: Report given to RN and Post -op Vital signs reviewed and stable  Post vital signs: Reviewed and stable  Last Vitals:  Vitals Value Taken Time  BP 134/86 11/12/22 1440  Temp    Pulse 70 11/12/22 1443  Resp 16 11/12/22 1443  SpO2 95 % 11/12/22 1443  Vitals shown include unvalidated device data.  Last Pain:  Vitals:   11/12/22 1050  TempSrc: Oral  PainSc: 1          Complications: No notable events documented.

## 2022-11-12 NOTE — Anesthesia Procedure Notes (Signed)
Procedure Name: Intubation Date/Time: 11/12/2022 11:51 AM  Performed by: Biagio Borg, CRNAPre-anesthesia Checklist: Patient identified, Emergency Drugs available, Suction available and Patient being monitored Patient Re-evaluated:Patient Re-evaluated prior to induction Oxygen Delivery Method: Circle system utilized Preoxygenation: Pre-oxygenation with 100% oxygen Induction Type: IV induction Ventilation: Mask ventilation without difficulty Laryngoscope Size: Mac and 3 Grade View: Grade I Tube type: Oral Tube size: 7.0 mm Number of attempts: 1 Airway Equipment and Method: Stylet Placement Confirmation: ETT inserted through vocal cords under direct vision, positive ETCO2 and breath sounds checked- equal and bilateral Secured at: 22 cm Tube secured with: Tape Dental Injury: Teeth and Oropharynx as per pre-operative assessment

## 2022-11-13 ENCOUNTER — Other Ambulatory Visit: Payer: Self-pay | Admitting: *Deleted

## 2022-11-13 ENCOUNTER — Telehealth: Payer: Self-pay

## 2022-11-13 ENCOUNTER — Encounter: Payer: Self-pay | Admitting: Urology

## 2022-11-13 DIAGNOSIS — N200 Calculus of kidney: Secondary | ICD-10-CM

## 2022-11-13 MED ORDER — OXYCODONE HCL 5 MG PO TABS: 5.0000 mg | ORAL_TABLET | Freq: Four times a day (QID) | ORAL | 0 refills | Status: DC | PRN

## 2022-11-13 NOTE — Anesthesia Postprocedure Evaluation (Signed)
Anesthesia Post Note  Patient: Robin Diaz  Procedure(s) Performed: CYSTOSCOPY/URETEROSCOPY/HOLMIUM LASER/STENT PLACEMENT (Left) CYSTOSCOPY WITH STENT REMOVAL (Right) CYSTOSCOPY/URETEROSCOPY/HOLMIUM LASER (Right)  Patient location during evaluation: PACU Anesthesia Type: General Level of consciousness: awake and alert, oriented and patient cooperative Pain management: pain level controlled Vital Signs Assessment: post-procedure vital signs reviewed and stable Respiratory status: spontaneous breathing, nonlabored ventilation and respiratory function stable Cardiovascular status: blood pressure returned to baseline and stable Postop Assessment: adequate PO intake Anesthetic complications: no   No notable events documented.   Last Vitals:  Vitals:   11/12/22 1515 11/12/22 1544  BP: 131/85 122/78  Pulse: 67 (!) 59  Resp: 14 16  Temp:  (!) 36.2 C  SpO2: 96% 99%    Last Pain:  Vitals:   11/13/22 0858  TempSrc:   PainSc: 0-No pain                 Darrin Nipper

## 2022-11-13 NOTE — Telephone Encounter (Signed)
Pt would like a refill of pain medication. Oxycodone 5 mg.  Took last pain med at 9pm. Taking Ibup 800mg  at 8am.   Pain is a 3-4 at the moment.   [facility].   Pls advise.

## 2022-11-15 NOTE — Telephone Encounter (Signed)
Patient called after hours last night complaining of fever. I spoke to patient and she states today she is feeling better, I informed her if she notices a feer again greater than 101 to go to ER to be evaluated. Patient voiced understanding.

## 2022-11-18 LAB — CALCULI, WITH PHOTOGRAPH (CLINICAL LAB)
Calcium Oxalate Dihydrate: 80 %
Hydroxyapatite: 20 %
Weight Calculi: 1 mg

## 2022-11-19 ENCOUNTER — Ambulatory Visit
Admission: RE | Admit: 2022-11-19 | Discharge: 2022-11-19 | Disposition: A | Discharge status: Home / Self Care | Payer: Medicare Other | Attending: Urology | Admitting: Urology

## 2022-11-19 ENCOUNTER — Ambulatory Visit
Admission: RE | Admit: 2022-11-19 | Discharge: 2022-11-19 | Disposition: A | Discharge status: Home / Self Care | Payer: Medicare Other | Source: Ambulatory Visit | Attending: Urology | Admitting: Urology

## 2022-11-19 DIAGNOSIS — N200 Calculus of kidney: Secondary | ICD-10-CM | POA: Insufficient documentation

## 2022-11-22 ENCOUNTER — Encounter: Payer: Self-pay | Admitting: Urology

## 2022-11-22 ENCOUNTER — Other Ambulatory Visit: Payer: Self-pay | Admitting: Urology

## 2022-11-25 ENCOUNTER — Other Ambulatory Visit: Payer: Self-pay | Admitting: *Deleted

## 2022-11-25 DIAGNOSIS — N200 Calculus of kidney: Secondary | ICD-10-CM

## 2022-11-28 ENCOUNTER — Ambulatory Visit
Admission: RE | Admit: 2022-11-28 | Discharge: 2022-11-28 | Disposition: A | Discharge status: Home / Self Care | Payer: Medicare Other | Source: Ambulatory Visit | Attending: Urology | Admitting: Urology

## 2022-11-28 DIAGNOSIS — N200 Calculus of kidney: Secondary | ICD-10-CM

## 2022-11-29 ENCOUNTER — Encounter: Payer: Self-pay | Admitting: Family Medicine

## 2022-12-02 ENCOUNTER — Other Ambulatory Visit: Payer: Self-pay | Admitting: Family Medicine

## 2022-12-02 ENCOUNTER — Telehealth: Payer: Self-pay | Admitting: Urology

## 2022-12-02 DIAGNOSIS — N200 Calculus of kidney: Secondary | ICD-10-CM

## 2022-12-02 MED ORDER — OXYBUTYNIN CHLORIDE 5 MG PO TABS: 5.0000 mg | ORAL_TABLET | Freq: Four times a day (QID) | ORAL | 0 refills | Status: DC | PRN

## 2022-12-02 MED ORDER — TAMSULOSIN HCL 0.4 MG PO CAPS: 0.4000 mg | ORAL_CAPSULE | Freq: Every day | ORAL | 0 refills | Status: DC

## 2022-12-02 NOTE — Telephone Encounter (Signed)
Spoke with patient regarding KUB results.  Compared with her prior KUB marked decrease in calyceal burden with fair amount of fine particles in the proximal and mid ureter alongside the stent.  Recommend repeat KUB next week.  We premature discussed stent removal could result in colic and the need for repeat ureteroscopy.  Her symptoms are better on Azo.  She is out of oxybutynin which was refilled.  Tamsulosin was also refilled.

## 2022-12-16 ENCOUNTER — Ambulatory Visit
Admission: RE | Admit: 2022-12-16 | Discharge: 2022-12-16 | Disposition: A | Discharge status: Home / Self Care | Payer: Medicare Other | Source: Ambulatory Visit | Attending: Urology | Admitting: Urology

## 2022-12-16 ENCOUNTER — Ambulatory Visit
Admission: RE | Admit: 2022-12-16 | Discharge: 2022-12-16 | Disposition: A | Discharge status: Home / Self Care | Payer: Medicare Other | Attending: Urology | Admitting: Urology

## 2022-12-16 DIAGNOSIS — N200 Calculus of kidney: Secondary | ICD-10-CM

## 2022-12-18 ENCOUNTER — Encounter: Payer: Self-pay | Admitting: Urology

## 2022-12-19 ENCOUNTER — Telehealth: Payer: Self-pay

## 2022-12-19 ENCOUNTER — Other Ambulatory Visit: Payer: Self-pay | Admitting: Urology

## 2022-12-19 ENCOUNTER — Other Ambulatory Visit: Payer: Self-pay

## 2022-12-19 DIAGNOSIS — N200 Calculus of kidney: Secondary | ICD-10-CM

## 2022-12-19 LAB — URINALYSIS, COMPLETE
Bilirubin, UA: NEGATIVE
Glucose, UA: NEGATIVE
Ketones, UA: NEGATIVE
Nitrite, UA: NEGATIVE
Specific Gravity, UA: 1.015 (ref 1.005–1.030)
Urobilinogen, Ur: 0.2 mg/dL (ref 0.2–1.0)
pH, UA: 7.5 (ref 5.0–7.5)

## 2022-12-19 LAB — MICROSCOPIC EXAMINATION

## 2022-12-19 NOTE — Telephone Encounter (Signed)
I spoke with Robin Diaz. We have discussed possible surgery dates and Tuesday February 27th, 2024 was agreed upon by all parties. Patient given information about surgery date, what to expect pre-operatively and post operatively.  We discussed that a Pre-Admission Testing office will be calling to set up the pre-op visit that will take place prior to surgery, and that these appointments are typically done over the phone with a Pre-Admissions RN. Informed patient that our office will communicate any additional care to be provided after surgery. Patients questions or concerns were discussed during our call. Advised to call our office should there be any additional information, questions or concerns that arise. Patient verbalized understanding.

## 2022-12-19 NOTE — Progress Notes (Signed)
   [facility] From  Surgery Date: Date: 12/24/2022  Surgeon: Dr. John Giovanni, MD  Inpt ( No  )   Outpt (Yes)   Obs ( No  )   Diagnosis: N20.0 Left Nephrolithiasis  -CPT: [phone]  Surgery: Left Ureteroscopy with laser lithotripsy and stent exchange  Stop Anticoagulations: No  Cardiac/Medical/Pulmonary Clearance needed: no  *Orders entered into EPIC  Date: 12/19/22   *Case booked in [place]  Date: 12/19/22  *Notified pt of Surgery: Date: 12/19/22  PRE-OP UA & CX: yes, will obtain in clinic today 12/19/2022  *Placed into Prior Authorization Work Fabio Bering Date: 12/19/22  Assistant/laser/rep:No

## 2022-12-19 NOTE — Progress Notes (Signed)
Surgical Physician Order Form [facility] [place]  * Scheduling expectation : 12/24/2022   *Length of Case: 60 minutes  *Clearance needed: no  *Anticoagulation Instructions: N/A  *Aspirin Instructions: N/A  *Post-op visit Date/Instructions:  1 month follow up  *Diagnosis: Left Nephrolithiasis  *Procedure:  Left   Ureteroscopy w/laser lithotripsy & stent exchange [id number])   Additional orders: N/A  -Admit type: OUTpatient  -Anesthesia: General  -VTE Prophylaxis Standing Order SCD's       Other:   -Standing Lab Orders Per Anesthesia    Lab other: UA&Urine Culture  -Standing Test orders EKG/Chest x-ray per Anesthesia       Test other:   - Medications:  Ancef 2gm IV  -Other orders:   Was COVID-positive 12/09/2022

## 2022-12-22 LAB — CULTURE, URINE COMPREHENSIVE

## 2022-12-23 ENCOUNTER — Encounter
Admission: RE | Admit: 2022-12-23 | Discharge: 2022-12-23 | Disposition: A | Discharge status: Home / Self Care | Payer: Medicare Other | Source: Ambulatory Visit | Attending: Urology | Admitting: Urology

## 2022-12-23 DIAGNOSIS — Z01818 Encounter for other preprocedural examination: Secondary | ICD-10-CM

## 2022-12-23 MED ORDER — FAMOTIDINE 20 MG PO TABS: 20.0000 mg | ORAL_TABLET | Freq: Once | ORAL | Status: AC

## 2022-12-23 MED ORDER — CEFAZOLIN SODIUM-DEXTROSE 2-4 GM/100ML-% IV SOLN
2.0000 g | INTRAVENOUS | Status: AC
  Administered 2022-12-24: 2 g via INTRAVENOUS

## 2022-12-23 MED ORDER — LACTATED RINGERS IV SOLN: INTRAVENOUS | Status: DC

## 2022-12-23 MED ORDER — CHLORHEXIDINE GLUCONATE 0.12 % MT SOLN: 15.0000 mL | Freq: Once | OROMUCOSAL | Status: AC

## 2022-12-23 MED ORDER — ORAL CARE MOUTH RINSE: 15.0000 mL | Freq: Once | OROMUCOSAL | Status: AC

## 2022-12-23 NOTE — Patient Instructions (Addendum)
Your procedure is scheduled on:12-24-22 Tuesday Report to the Registration Desk on the 1st floor of the [facility].Then proceed to the 2nd floor Surgery Desk To find out your arrival time, please call [phone] between 1PM - 3PM on:12-23-22 Monday If your arrival time is 6:00 am, do not arrive prior to that time as the [facility] entrance doors do not open until 6:00 am.   REMEMBER: Instructions that are not followed completely may result in serious medical risk, up to and including death; or upon the discretion of your surgeon and anesthesiologist your surgery may need to be rescheduled.   Do not eat food OR drink any liquids after midnight the night before surgery.  No gum chewing, lozengers or hard candies.   TAKE THESE MEDICATIONS THE MORNING OF SURGERY WITH A SIP OF WATER: none   One week prior to surgery: Stop Anti-inflammatories (NSAIDS) such as Advil, Aleve, Ibuprofen, Motrin, Naproxen, Naprosyn and Aspirin based products such as Excedrin, Goodys Powder, BC Powder. You may however, take Tylenol if needed for pain up until the day of surgery.   No Alcohol for 24 hours before or after surgery.   No Smoking including e-cigarettes for 24 hours prior to surgery.  No chewable tobacco products for at least 6 hours prior to surgery.  No nicotine patches on the day of surgery.   Do not use any "recreational" drugs for at least a week prior to your surgery.  Please be advised that the combination of cocaine and anesthesia may have negative outcomes, up to and including death. If you test positive for cocaine, your surgery will be cancelled.   On the morning of surgery brush your teeth with toothpaste and water, you may rinse your mouth with mouthwash if you wish. Do not swallow any toothpaste or mouthwash.   Do not wear jewelry, make-up, hairpins, clips or nail polish.   Do not wear lotions, powders, or perfumes.    Do not shave body from the neck down 48 hours prior to  surgery just in case you cut yourself which could leave a site for infection.  Also, freshly shaved skin may become irritated if using the CHG soap.   Contact lenses, hearing aids and dentures may not be worn into surgery.   Do not bring valuables to the hospital. [facility] is not responsible for any missing/lost belongings or valuables.    Notify your doctor if there is any change in your medical condition (cold, fever, infection).   Wear comfortable clothing (specific to your surgery type) to the hospital.   After surgery, you can help prevent lung complications by doing breathing exercises.  Take deep breaths and cough every 1-2 hours. Your doctor may order a device called an Incentive Spirometer to help you take deep breaths. When coughing or sneezing, hold a pillow firmly against your incision with both hands. This is called "splinting." Doing this helps protect your incision. It also decreases belly discomfort.   If you are being admitted to the hospital overnight, leave your suitcase in the car. After surgery it may be brought to your room.   If you are being discharged the day of surgery, you will not be allowed to drive home. You will need a responsible adult (18 years or older) to drive you home and stay with you that night.    If you are taking public transportation, you will need to have a responsible adult (18 years or older) with you. Please confirm with your  physician that it is acceptable to use public transportation.    Please call the [place] Dept. at [phone] if you have any questions about these instructions.   Surgery Visitation Policy:   Patients undergoing a surgery or procedure may have two family members or support persons with them as long as the person is not COVID-19 positive or experiencing its symptoms.    Due to an increase in RSV and influenza rates and associated hospitalizations, children ages 3 and under will not be able to  visit patients in [facility]. Masks continue to be strongly recommended.

## 2022-12-24 ENCOUNTER — Ambulatory Visit: Payer: Medicare Other

## 2022-12-24 ENCOUNTER — Encounter: Payer: Self-pay | Admitting: Urology

## 2022-12-24 ENCOUNTER — Ambulatory Visit
Admission: RE | Admit: 2022-12-24 | Discharge: 2022-12-24 | Disposition: A | Discharge status: Home / Self Care | Payer: Medicare Other | Attending: Urology | Admitting: Urology

## 2022-12-24 ENCOUNTER — Other Ambulatory Visit: Payer: Self-pay

## 2022-12-24 ENCOUNTER — Encounter
Admission: RE | Disposition: A | Discharge status: Home / Self Care | Payer: Self-pay | Source: Home / Self Care | Attending: Urology

## 2022-12-24 DIAGNOSIS — N202 Calculus of kidney with calculus of ureter: Secondary | ICD-10-CM | POA: Insufficient documentation

## 2022-12-24 DIAGNOSIS — G35 Multiple sclerosis: Secondary | ICD-10-CM | POA: Diagnosis not present

## 2022-12-24 DIAGNOSIS — Z975 Presence of (intrauterine) contraceptive device: Secondary | ICD-10-CM | POA: Insufficient documentation

## 2022-12-24 DIAGNOSIS — Z01818 Encounter for other preprocedural examination: Secondary | ICD-10-CM

## 2022-12-24 DIAGNOSIS — N200 Calculus of kidney: Secondary | ICD-10-CM

## 2022-12-24 HISTORY — PX: CYSTOSCOPY/URETEROSCOPY/HOLMIUM LASER/STENT PLACEMENT: SHX6546

## 2022-12-24 LAB — POCT PREGNANCY, URINE: Preg Test, Ur: NEGATIVE

## 2022-12-24 SURGERY — CYSTOSCOPY/URETEROSCOPY/HOLMIUM LASER/STENT PLACEMENT
Anesthesia: General | Site: Ureter | Laterality: Left

## 2022-12-24 MED ORDER — MIDAZOLAM HCL 2 MG/2ML IJ SOLN
INTRAMUSCULAR | Status: DC | PRN
  Administered 2022-12-24: 2 mg via INTRAVENOUS

## 2022-12-24 MED ORDER — FAMOTIDINE 20 MG PO TABS
ORAL_TABLET | ORAL | Status: AC
  Administered 2022-12-24: 20 mg via ORAL
  Filled 2022-12-24: qty 1

## 2022-12-24 MED ORDER — OXYCODONE HCL 5 MG/5ML PO SOLN: 5.0000 mg | Freq: Once | ORAL | Status: AC | PRN

## 2022-12-24 MED ORDER — DEXAMETHASONE SODIUM PHOSPHATE 10 MG/ML IJ SOLN
INTRAMUSCULAR | Status: DC | PRN
  Administered 2022-12-24: 5 mg via INTRAVENOUS

## 2022-12-24 MED ORDER — ONDANSETRON HCL 4 MG/2ML IJ SOLN
INTRAMUSCULAR | Status: DC | PRN
  Administered 2022-12-24: 4 mg via INTRAVENOUS

## 2022-12-24 MED ORDER — LACTATED RINGERS IV SOLN: INTRAVENOUS | Status: DC | PRN

## 2022-12-24 MED ORDER — EPHEDRINE SULFATE (PRESSORS) 50 MG/ML IJ SOLN
INTRAMUSCULAR | Status: DC | PRN
  Administered 2022-12-24: 10 mg via INTRAVENOUS
  Administered 2022-12-24 (×3): 5 mg via INTRAVENOUS

## 2022-12-24 MED ORDER — FENTANYL CITRATE (PF) 100 MCG/2ML IJ SOLN
INTRAMUSCULAR | Status: AC
  Filled 2022-12-24: qty 2

## 2022-12-24 MED ORDER — PHENYLEPHRINE 80 MCG/ML (10ML) SYRINGE FOR IV PUSH (FOR BLOOD PRESSURE SUPPORT)
PREFILLED_SYRINGE | INTRAVENOUS | Status: AC
  Filled 2022-12-24: qty 40

## 2022-12-24 MED ORDER — IOHEXOL 180 MG/ML  SOLN
INTRAMUSCULAR | Status: DC | PRN
  Administered 2022-12-24: 10 mL

## 2022-12-24 MED ORDER — CHLORHEXIDINE GLUCONATE 0.12 % MT SOLN
OROMUCOSAL | Status: AC
  Administered 2022-12-24: 15 mL via OROMUCOSAL
  Filled 2022-12-24: qty 15

## 2022-12-24 MED ORDER — OXYCODONE HCL 5 MG PO TABS
ORAL_TABLET | ORAL | Status: AC
  Filled 2022-12-24: qty 1

## 2022-12-24 MED ORDER — FENTANYL CITRATE (PF) 100 MCG/2ML IJ SOLN
INTRAMUSCULAR | Status: DC | PRN
  Administered 2022-12-24 (×2): 50 ug via INTRAVENOUS

## 2022-12-24 MED ORDER — MIDAZOLAM HCL 2 MG/2ML IJ SOLN
INTRAMUSCULAR | Status: AC
  Filled 2022-12-24: qty 2

## 2022-12-24 MED ORDER — CEFAZOLIN SODIUM-DEXTROSE 2-4 GM/100ML-% IV SOLN
INTRAVENOUS | Status: AC
  Filled 2022-12-24: qty 100

## 2022-12-24 MED ORDER — PHENYLEPHRINE 80 MCG/ML (10ML) SYRINGE FOR IV PUSH (FOR BLOOD PRESSURE SUPPORT)
PREFILLED_SYRINGE | INTRAVENOUS | Status: DC | PRN
  Administered 2022-12-24: 160 ug via INTRAVENOUS
  Administered 2022-12-24: 80 ug via INTRAVENOUS
  Administered 2022-12-24 (×2): 160 ug via INTRAVENOUS
  Administered 2022-12-24: 80 ug via INTRAVENOUS
  Administered 2022-12-24 (×2): 160 ug via INTRAVENOUS

## 2022-12-24 MED ORDER — LIDOCAINE HCL (CARDIAC) PF 100 MG/5ML IV SOSY
PREFILLED_SYRINGE | INTRAVENOUS | Status: DC | PRN
  Administered 2022-12-24: 100 mg via INTRAVENOUS

## 2022-12-24 MED ORDER — OXYCODONE HCL 5 MG PO TABS
5.0000 mg | ORAL_TABLET | Freq: Once | ORAL | Status: AC | PRN
  Administered 2022-12-24: 5 mg via ORAL

## 2022-12-24 MED ORDER — EPHEDRINE 5 MG/ML INJ
INTRAVENOUS | Status: AC
  Filled 2022-12-24: qty 10

## 2022-12-24 MED ORDER — PROPOFOL 10 MG/ML IV BOLUS
INTRAVENOUS | Status: DC | PRN
  Administered 2022-12-24: 150 mg via INTRAVENOUS

## 2022-12-24 MED ORDER — SODIUM CHLORIDE 0.9 % IR SOLN
Status: DC | PRN
  Administered 2022-12-24: 3000 mL via INTRAVESICAL

## 2022-12-24 MED ORDER — FENTANYL CITRATE (PF) 100 MCG/2ML IJ SOLN
25.0000 ug | INTRAMUSCULAR | Status: DC | PRN
  Administered 2022-12-24: 25 ug via INTRAVENOUS

## 2022-12-24 SURGICAL SUPPLY — 29 items
BAG DRAIN SIEMENS DORNER NS (MISCELLANEOUS) ×2 IMPLANT
BAG DRN NS LF (MISCELLANEOUS) ×1
BASKET 3 PRONG GRASPER (BASKET) IMPLANT
BASKET ZERO TIP 1.9FR (BASKET) IMPLANT
BRUSH SCRUB EZ 1% IODOPHOR (MISCELLANEOUS) ×2 IMPLANT
BSKT STON RTRVL ZERO TP 1.9FR (BASKET) ×1
CATH URET FLEX-TIP 2 LUMEN 10F (CATHETERS) IMPLANT
CATH URETL OPEN END 6X70 (CATHETERS) IMPLANT
CNTNR URN SCR LID CUP LEK RST (MISCELLANEOUS) IMPLANT
CONT SPEC 4OZ STRL OR WHT (MISCELLANEOUS)
DRAPE UTILITY 15X26 TOWEL STRL (DRAPES) ×2 IMPLANT
FIBER LASER MOSES 200 DFL (Laser) IMPLANT
GLOVE SURG UNDER POLY LF SZ7.5 (GLOVE) ×2 IMPLANT
GOWN STRL REUS W/ TWL LRG LVL3 (GOWN DISPOSABLE) ×2 IMPLANT
GOWN STRL REUS W/ TWL XL LVL3 (GOWN DISPOSABLE) ×2 IMPLANT
GOWN STRL REUS W/TWL LRG LVL3 (GOWN DISPOSABLE) ×1
GOWN STRL REUS W/TWL XL LVL3 (GOWN DISPOSABLE) ×1
GUIDEWIRE STR DUAL SENSOR (WIRE) ×2 IMPLANT
IV NS IRRIG 3000ML ARTHROMATIC (IV SOLUTION) ×2 IMPLANT
KIT TURNOVER CYSTO (KITS) ×2 IMPLANT
PACK CYSTO AR (MISCELLANEOUS) ×2 IMPLANT
SET CYSTO W/LG BORE CLAMP LF (SET/KITS/TRAYS/PACK) ×2 IMPLANT
SHEATH NAVIGATOR HD 12/14X36 (SHEATH) IMPLANT
STENT URET 6FRX24 CONTOUR (STENTS) IMPLANT
STENT URET 6FRX26 CONTOUR (STENTS) IMPLANT
SURGILUBE 2OZ TUBE FLIPTOP (MISCELLANEOUS) ×2 IMPLANT
TRACTIP FLEXIVA PULSE ID 200 (Laser) IMPLANT
VALVE UROSEAL ADJ ENDO (VALVE) IMPLANT
WATER STERILE IRR 500ML POUR (IV SOLUTION) ×2 IMPLANT

## 2022-12-24 NOTE — Interval H&P Note (Signed)
History and Physical Interval Note:  CV: RRR Lungs: Clear  12/24/2022 1:29 PM  Robin Diaz  has presented today for surgery, with the diagnosis of Left Nephrolithiasis.  The various methods of treatment have been discussed with the patient and family. After consideration of risks, benefits and other options for treatment, the patient has consented to  Procedure(s): CYSTOSCOPY/URETEROSCOPY/HOLMIUM LASER/STENT EXCHANGE (Left) as a surgical intervention.  The patient's history has been reviewed, patient examined, no change in status, stable for surgery.  I have reviewed the patient's chart and labs.  Questions were answered to the patient's satisfaction.     [place]

## 2022-12-24 NOTE — Discharge Instructions (Addendum)
AMBULATORY SURGERY  DISCHARGE INSTRUCTIONS   The drugs that you were given will stay in your system until tomorrow so for the next 24 hours you should not:  Drive an automobile Make any legal decisions Drink any alcoholic beverage   You may resume regular meals tomorrow.  Today it is better to start with liquids and gradually work up to solid foods.  You may eat anything you prefer, but it is better to start with liquids, then soup and crackers, and gradually work up to solid foods.   Please notify your doctor immediately if you have any unusual bleeding, trouble breathing, redness and pain at the surgery site, drainage, fever, or pain not relieved by medication.    Additional Instructions:  DISCHARGE INSTRUCTIONS FOR KIDNEY STONE/URETERAL STENT   MEDICATIONS:  1. Resume all your other meds from home.  2.  AZO (over-the-counter) can help with the burning/stinging when you urinate.   ACTIVITY:  1. May resume regular activities in 24 hours. 2. No driving while on narcotic pain medications  3. Drink plenty of water  4. Continue to walk at home - you can still get blood clots when you are at home, so keep active, but don't over do it.  5. May return to work/school tomorrow or when you feel ready   BATHING:  1. You can shower. 2. You have a string coming from your urethra: The stent string is attached to your ureteral stent. Do not pull on this.   SIGNS/SYMPTOMS TO CALL:  Common postoperative symptoms include urinary frequency, urgency, bladder spasm and blood in the urine  Please call us if you have a fever greater than 101.5, uncontrolled nausea/vomiting, uncontrolled pain, dizziness, unable to urinate, excessively bloody urine, chest pain, shortness of breath, leg swelling, leg pain, or any other concerns or questions.   You can reach [place] at [phone].   FOLLOW-UP:  1. You will be contacted for a follow-up appointment in ~ 1 month 2. You have a string attached to your  stent, you may remove it on Thursday, 12/26/2022. To do this, pull the string until the stent is completely removed. You may feel an odd sensation in your back.       Please contact your physician with any problems or Same Day Surgery at [phone], Monday through Friday 6 am to 4 pm, or [place] [facility] [place] number at [phone].

## 2022-12-24 NOTE — Op Note (Signed)
   Preoperative diagnosis:  Left ureteral stone fragments Left nephrolithiasis  Postoperative diagnosis: Same  Procedure:  Cystoscopy Left ureteroscopy and stone removal Ureteroscopic laser lithotripsy Left ureteral stent exchange (31F/26 cm)  Left retrograde pyelography with interpretation  Surgeon: Nicki Reaper C. Stanislav Gervase, M.D.  Anesthesia: General  Complications: None  Intraoperative findings: Multiple small ureteral fragments throughout the left ureter; several removed with a 1.9 [place] nitinol basket and the most were minute 2-3 fragments and lower calyx estimated 3 mm in size removed with basket One 5 mm lower calyceal fragment which was dusted with the holmium laser at a setting of 0.3J/40 Hz Left retrograde pyelography post procedure showed no filling defects, stone fragments or contrast extravasation  EBL: Minimal  Specimens: None   Indication: Robin Diaz is a 42 y.o. After reviewing the management options for treatment, the patient elected to proceed with the above surgical procedure(s). We have discussed the potential benefits and risks of the procedure, side effects of the proposed treatment, the likelihood of the patient achieving the goals of the procedure, and any potential problems that might occur during the procedure or recuperation. Informed consent has been obtained.  Description of procedure:  The patient was taken to the operating room and general anesthesia was induced.  The patient was placed in the dorsal lithotomy position, prepped and draped in the usual sterile fashion, and preoperative antibiotics were administered. A preoperative time-out was performed.   A 21 French cystoscope was lubricated and passed per urethra.  The stent was grasped with endoscopic forceps and brought out through the urethral meatus a 0.038 Sensor wire was then placed through the stent into the renal pelvis under fluoroscopic guidance.  A 4.5 Fr semirigid ureteroscope was  then advanced into the ureter next to the guidewire with findings as described above.  Once the ureter had been cleared of significantly sized fragments the semirigid ureteroscope was removed and a single channel digital flexible ureteroscope was placed in the bladder and easily inserted into the left UO alongside the guidewire.  Pyeloscopy was performed with findings as described above.  Retrograde pyelogram was then performed through the ureteroscope with findings as described above.  All calyces were then examined under fluoroscopic guidance and no significantly sized fragments were identified.  The ureteroscope was removed under direct vision and no additional ureteral fragments were identified.  A 31F/24 cm Contour ureteral stent with tether was placed under fluoroscopic guidance.  The wire was then removed with an adequate stent curl noted in the renal pelvis as well as in the bladder.  The bladder was then emptied and the procedure ended.  The patient appeared to tolerate the procedure well and without complications.  After anesthetic reversal the patient was transported to the PACU in stable condition.   Plan: She was instructed to remove her stent on Thursday, 12/26/2022 Follow-up office visit with KUB in 1 month.  Metabolic evaluation will be discussed on follow-up   John Giovanni, MD

## 2022-12-24 NOTE — Anesthesia Preprocedure Evaluation (Signed)
Anesthesia Evaluation  Patient identified by MRN, date of birth, ID band Patient awake    Reviewed: Allergy & Precautions, NPO status , Patient's Chart, lab work & pertinent test results  History of Anesthesia Complications Negative for: history of anesthetic complications  Airway Mallampati: II  TM Distance: >3 FB Neck ROM: Full    Dental no notable dental hx.    Pulmonary neg pulmonary ROS, neg COPD   Pulmonary exam normal breath sounds clear to auscultation       Cardiovascular hypertension, Pt. 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) 2015: Multiple sclerosis ([place]) No date: Rh negative, maternal  Past Surgical History: 02/2002: BREAST REDUCTION SURGERY 10/20/2022: CYSTOSCOPY W/ RETROGRADES; Bilateral     Comment:  Procedure: CYSTOSCOPY WITH RETROGRADE PYELOGRAM;                Surgeon: Abbie Sons, MD;  Location: [facility] ORS;                Service: Urology;  Laterality: Bilateral; 10/20/2022: CYSTOSCOPY WITH STENT PLACEMENT; Bilateral     Comment:  Procedure: CYSTOSCOPY WITH BILATERAL  STENT PLACEMENT;                Surgeon: Abbie Sons, MD;  Location: [facility] ORS;                Service: Urology;  Laterality: Bilateral; No date: INTRAUTERINE DEVICE (IUD) INSERTION  BMI    Body Mass  Index: 29.76 kg/m      Reproductive/Obstetrics negative OB ROS                              Anesthesia Physical Anesthesia Plan  ASA: 2  Anesthesia Plan: General   Post-op Pain Management:    Induction: Intravenous  PONV Risk Score and Plan: 3 and Ondansetron, Dexamethasone and Midazolam  Airway Management Planned: LMA  Additional Equipment:   Intra-op Plan:   Post-operative Plan: Extubation in OR  Informed Consent: I have reviewed the patients History and Physical, chart, labs and discussed the procedure including the risks, benefits and alternatives for the proposed anesthesia with the patient or authorized representative who has indicated his/her understanding and acceptance.     Dental advisory given  Plan Discussed with: CRNA  Anesthesia Plan Comments: (Anesthetic considerations for multiple sclerosis: closely monitor body temperature to avoid increase above baseline, avoid succinylcholine, pt may have altered sensitivity to NDMRs. )        Anesthesia Quick Evaluation

## 2022-12-24 NOTE — H&P (Signed)
Urology H&P   History of Present Illness: Robin Diaz is a 42 y.o. with a history of bilateral renal pelvic calculi status post staged ureteroscopy.  She last underwent left ureteroscopy 11/12/2022.  KUB showed excellent fragmentation with fairly significant volume of fine particles in the ureter.  She elected stent removal under anesthesia with ureteroscopy.  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)   Multiple sclerosis ([place]) 2015   Rh negative, maternal     Past Surgical History:  Procedure Laterality Date   BREAST REDUCTION SURGERY  02/2002   CYSTOSCOPY W/ RETROGRADES Bilateral 10/20/2022   Procedure: CYSTOSCOPY WITH RETROGRADE PYELOGRAM;  Surgeon: Abbie Sons, MD;  Location: [facility];  Service: Urology;  Laterality: Bilateral;   CYSTOSCOPY W/ URETERAL STENT REMOVAL Right 11/12/2022   Procedure: CYSTOSCOPY WITH STENT REMOVAL;  Surgeon: Abbie Sons, MD;  Location: [facility];  Service: Urology;  Laterality: Right;   CYSTOSCOPY WITH STENT PLACEMENT Bilateral 10/20/2022   Procedure: CYSTOSCOPY WITH BILATERAL  STENT PLACEMENT;  Surgeon: Abbie Sons, MD;  Location: [facility];  Service: Urology;  Laterality: Bilateral;   CYSTOSCOPY/URETEROSCOPY/HOLMIUM LASER Right 11/12/2022   Procedure: CYSTOSCOPY/URETEROSCOPY/HOLMIUM LASER;  Surgeon: Abbie Sons, MD;  Location: [facility];  Service: Urology;  Laterality: Right;   CYSTOSCOPY/URETEROSCOPY/HOLMIUM LASER/STENT PLACEMENT Right 11/05/2022   Procedure: CYSTOSCOPY/URETEROSCOPY/HOLMIUM LASER/STENT EXCHANGE;  Surgeon: Abbie Sons, MD;  Location: [facility];  Service: Urology;  Laterality: Right;   CYSTOSCOPY/URETEROSCOPY/HOLMIUM LASER/STENT PLACEMENT Left 11/12/2022   Procedure: CYSTOSCOPY/URETEROSCOPY/HOLMIUM LASER/STENT PLACEMENT;  Surgeon: Abbie Sons, MD;  Location: [facility];  Service: Urology;  Laterality: Left;   INTRAUTERINE DEVICE (IUD) INSERTION      Home Medications:  Current Meds  Medication Sig   Cholecalciferol (VITAMIN D-1000 MAX ST) 1000 units tablet Take 1,000 Units by mouth daily.   estradiol (VIVELLE-DOT) 0.05 MG/24HR patch Place 1 patch onto the skin 2 (two) times a week.   LEXAPRO 20 MG tablet Take 20 mg by mouth at bedtime.   losartan (COZAAR) 50 MG tablet Take 25 mg by mouth at bedtime.   melatonin 3 MG TABS tablet Take 3 mg by mouth at bedtime.   oxybutynin (DITROPAN) 5 MG tablet Take 1 tablet (5 mg total) by mouth every 6 (six) hours as needed for bladder spasms (frequency,urgency).   oxyCODONE (OXY IR/ROXICODONE) 5 MG immediate release tablet Take 1 tablet (5 mg total) by mouth every 6 (six) hours as needed for moderate pain.   tamsulosin (FLOMAX) 0.4 MG CAPS capsule Take 1 capsule (0.4 mg total) by mouth daily after breakfast.   vitamin B-12 (CYANOCOBALAMIN) 1000 MCG tablet Take 1,000 mcg by mouth daily.    Allergies:  Allergies  Allergen Reactions   Gadobenate Swelling, Hives and Itching    Lower lip swelling.  11/2 - BREAKTHROUGH RXN - Hives/Itching on Rt side - treated with PO Benadryl. Manuela Schwartz RN   Lower lip swelling.   Iodinated Contrast Media     Other Reaction(s): Other (See Comments)  Lip and tongue swelling    Family History  Problem Relation Age of Onset   Hypertension Father    Colon cancer Maternal Uncle 78   Breast cancer Paternal Grandmother 13       second  Colon cancer Paternal Grandmother 77       first   Lung cancer Paternal Grandmother        third   Breast cancer Cousin 51       paternal cousin    Social History:  reports that she has never smoked. She has never used smokeless tobacco. She reports that she does not drink alcohol and does not use drugs.  ROS: A complete review of systems was performed.  All systems are negative except for pertinent findings as  noted.  Physical Exam:  Vital signs in last 24 hours: Temp:  [97.4 F (36.3 C)] 97.4 F (36.3 C) (02/27 1028) Pulse Rate:  [82] 82 (02/27 1028) Resp:  [18] 18 (02/27 1028) BP: (131)/(96) 131/96 (02/27 1028) SpO2:  [97 %] 97 % (02/27 1028) Weight:  [93 kg] 93 kg (02/27 1028) Constitutional:  Alert and oriented, No acute distress HEENT: [place] AT, moist mucus membranes.  Trachea midline, no masses Respiratory: Normal respiratory effort Psychiatric: Normal mood and affect   Laboratory Data:  No results for input(s): "WBC", "HGB", "HCT" in the last 72 hours. No results for input(s): "NA", "K", "CL", "CO2", "GLUCOSE", "BUN", "CREATININE", "CALCIUM" in the last 72 hours. No results for input(s): "LABPT", "INR" in the last 72 hours. No results for input(s): "LABURIN" in the last 72 hours. Results for orders placed or performed in visit on 12/19/22  CULTURE, URINE COMPREHENSIVE     Status: None   Collection Time: 12/19/22  2:33 PM   Specimen: Urine   UR  Result Value Ref Range Status   Urine Culture, Comprehensive Final report  Final   Organism ID, Bacteria Comment  Final    Comment: Mixed urogenital flora 10,000-25,000 colony forming units per mL   Microscopic Examination     Status: Abnormal   Collection Time: 12/19/22  2:33 PM   Urine  Result Value Ref Range Status   WBC, UA 11-30 (A) 0 - 5 /hpf Final   RBC, Urine 11-30 (A) 0 - 2 /hpf Final   Epithelial Cells (non renal) 0-10 0 - 10 /hpf Final   Crystals Present (A) N/A Final   Crystal Type Amorphous Sediment N/A Final   Bacteria, UA Moderate (A) None seen/Few Final     Impression/Assessment:  Presents for follow-up ureteroscopy/stent removal.  We discussed the possible need for stent replacement depending on size of the remaining fragments though on KUB it appears to be just fine particles.  All questions were answered and she desires to proceed    12/24/2022, 1:24 PM  John Giovanni,  MD

## 2022-12-24 NOTE — Transfer of Care (Signed)
Immediate Anesthesia Transfer of Care Note  Patient: Robin Diaz  Procedure(s) Performed: CYSTOSCOPY/URETEROSCOPY/HOLMIUM LASER/STENT EXCHANGE (Left: Ureter)  Patient Location: PACU  Anesthesia Type:General  Level of Consciousness: drowsy  Airway & Oxygen Therapy: Patient Spontanous Breathing and Patient connected to face mask oxygen  Post-op Assessment: Report given to RN and Post -op Vital signs reviewed and stable  Post vital signs: Reviewed  Last Vitals:  Vitals Value Taken Time  BP 142/87 12/24/22 1504  Temp 52F   Pulse 100 12/24/22 1507  Resp 17 12/24/22 1507  SpO2 99 % 12/24/22 1507  Vitals shown include unvalidated device data.  Last Pain:  Vitals:   12/24/22 1028  TempSrc: Tympanic  PainSc: 4          Complications: No notable events documented.

## 2022-12-24 NOTE — Anesthesia Procedure Notes (Signed)
Procedure Name: LMA Insertion Date/Time: 12/24/2022 1:39 PM  Performed by: Otho Perl, CRNAPre-anesthesia Checklist: Patient identified, Patient being monitored, Timeout performed, Emergency Drugs available and Suction available Patient Re-evaluated:Patient Re-evaluated prior to induction Oxygen Delivery Method: Circle system utilized Preoxygenation: Pre-oxygenation with 100% oxygen Induction Type: IV induction Ventilation: Mask ventilation without difficulty LMA: LMA inserted LMA Size: 4.0 Tube type: Oral Number of attempts: 1 Placement Confirmation: positive ETCO2 and breath sounds checked- equal and bilateral Tube secured with: Tape Dental Injury: Teeth and Oropharynx as per pre-operative assessment

## 2022-12-25 ENCOUNTER — Encounter: Payer: Self-pay | Admitting: Urology

## 2022-12-25 NOTE — Anesthesia Postprocedure Evaluation (Signed)
Anesthesia Post Note  Patient: Robin Diaz  Procedure(s) Performed: CYSTOSCOPY/URETEROSCOPY/HOLMIUM LASER/STENT EXCHANGE (Left: Ureter)  Patient location during evaluation: PACU Anesthesia Type: General Level of consciousness: awake and alert Pain management: pain level controlled Vital Signs Assessment: post-procedure vital signs reviewed and stable Respiratory status: spontaneous breathing, nonlabored ventilation, respiratory function stable and patient connected to nasal cannula oxygen Cardiovascular status: blood pressure returned to baseline and stable Postop Assessment: no apparent nausea or vomiting Anesthetic complications: no   No notable events documented.   Last Vitals:  Vitals:   12/24/22 1530 12/24/22 1548  BP: 136/86 (!) 150/92  Pulse: 87 69  Resp: 13 18  Temp: 37 C 36.7 C  SpO2: 98%     Last Pain:  Vitals:   12/25/22 0843  TempSrc:   PainSc: 0-No pain                 Ilene Qua

## 2023-01-27 ENCOUNTER — Ambulatory Visit: Payer: Medicare Other | Admitting: Urology

## 2023-01-27 ENCOUNTER — Encounter: Payer: Self-pay | Admitting: Urology

## 2023-01-27 ENCOUNTER — Ambulatory Visit
Admission: RE | Admit: 2023-01-27 | Discharge: 2023-01-27 | Disposition: A | Discharge status: Home / Self Care | Payer: Medicare Other | Source: Ambulatory Visit | Attending: Urology | Admitting: Urology

## 2023-01-27 VITALS — BP 148/83 | HR 87 | Ht 67.0 in | Wt 198.0 lb

## 2023-01-27 DIAGNOSIS — N200 Calculus of kidney: Secondary | ICD-10-CM

## 2023-01-27 DIAGNOSIS — Z87442 Personal history of urinary calculi: Secondary | ICD-10-CM | POA: Diagnosis not present

## 2023-01-27 DIAGNOSIS — Z09 Encounter for follow-up examination after completed treatment for conditions other than malignant neoplasm: Secondary | ICD-10-CM | POA: Diagnosis not present

## 2023-01-27 NOTE — Patient Instructions (Signed)

## 2023-01-27 NOTE — Progress Notes (Signed)
I, [name],acting as a scribe for Abbie Sons, MD.,have documented all relevant documentation on the behalf of Abbie Sons, MD,as directed by  Abbie Sons, MD while in the presence of Abbie Sons, MD.   [name],acting as a scribe for Abbie Sons, MD.,have documented all relevant documentation on the behalf of Abbie Sons, MD,as directed by  Abbie Sons, MD while in the presence of Abbie Sons, MD.  01/27/2023 11:15 AM   Robin Diaz 09-03-1981 [id number]  Referring provider: [facility] [address] [place],  [place] [address]  Chief Complaint  Patient presents with   Follow-up   Urologic History 1.  Bilateral nephrolithiasis Hospitalized 10/19/2022 with 1 week history of dysuria, frequency, urgency and fever. UA with significant pyuria and a stone protocol CT showed bilateral renal pelvic calculi measuring >2 cm with bilateral moderate hydronephrosis R >L.  Creatinine was normal. Underwent cystoscopy with bilateral ureteral stent placement 10/20/2022  HPI: 42 y.o. female presents for follow-up visit.  History of bilateral renal pelvic calculi, status post staged ureteroscopy with last procedure performed on 12/24/2022.  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)   Multiple sclerosis 2015   Rh negative, maternal     Surgical History: Past Surgical History:  Procedure Laterality Date   BREAST REDUCTION SURGERY  02/2002   CYSTOSCOPY W/ RETROGRADES Bilateral 10/20/2022   Procedure:  CYSTOSCOPY WITH RETROGRADE PYELOGRAM;  Surgeon: Abbie Sons, MD;  Location: [facility];  Service: Urology;  Laterality: Bilateral;   CYSTOSCOPY W/ URETERAL STENT REMOVAL Right 11/12/2022   Procedure: CYSTOSCOPY WITH STENT REMOVAL;  Surgeon: Abbie Sons, MD;  Location: [facility];  Service: Urology;  Laterality: Right;   CYSTOSCOPY WITH STENT PLACEMENT Bilateral 10/20/2022   Procedure: CYSTOSCOPY WITH BILATERAL  STENT PLACEMENT;  Surgeon: Abbie Sons, MD;  Location: [facility];  Service: Urology;  Laterality: Bilateral;   CYSTOSCOPY/URETEROSCOPY/HOLMIUM LASER Right 11/12/2022   Procedure: CYSTOSCOPY/URETEROSCOPY/HOLMIUM LASER;  Surgeon: Abbie Sons, MD;  Location: [facility];  Service: Urology;  Laterality: Right;   CYSTOSCOPY/URETEROSCOPY/HOLMIUM LASER/STENT PLACEMENT Right 11/05/2022   Procedure: CYSTOSCOPY/URETEROSCOPY/HOLMIUM LASER/STENT EXCHANGE;  Surgeon: Abbie Sons, MD;  Location: [facility];  Service: Urology;  Laterality: Right;   CYSTOSCOPY/URETEROSCOPY/HOLMIUM LASER/STENT PLACEMENT Left 11/12/2022   Procedure: CYSTOSCOPY/URETEROSCOPY/HOLMIUM LASER/STENT PLACEMENT;  Surgeon: Abbie Sons, MD;  Location: [facility];  Service: Urology;  Laterality: Left;   CYSTOSCOPY/URETEROSCOPY/HOLMIUM LASER/STENT PLACEMENT Left 12/24/2022   Procedure: CYSTOSCOPY/URETEROSCOPY/HOLMIUM LASER/STENT EXCHANGE;  Surgeon: Abbie Sons, MD;  Location: [facility];  Service: Urology;  Laterality: Left;   INTRAUTERINE DEVICE (IUD) INSERTION      Home Medications:  Allergies as of 01/27/2023       Reactions   Gadobenate Swelling, Hives, Itching   Lower lip swelling. 11/2 - BREAKTHROUGH RXN - Hives/Itching on Rt side - treated with PO Benadryl. Manuela Schwartz RN   Lower lip swelling.   Iodinated Contrast Media    Other Reaction(s): Other (See Comments) Lip and  tongue swelling        Medication List        Accurate as of January 27, 2023 11:15 AM. If you have any questions, ask your nurse or doctor.           STOP taking these medications    oxybutynin 5 MG tablet Commonly known as: DITROPAN Stopped by: Abbie Sons, MD   oxyCODONE 5 MG immediate release tablet Commonly known as: Oxy IR/ROXICODONE Stopped by: Abbie Sons, MD   tamsulosin 0.4 MG Caps capsule Commonly known as: FLOMAX Stopped by: Abbie Sons, MD       TAKE these medications    cyanocobalamin 1000 MCG tablet Commonly known as: VITAMIN B12 Take 1,000 mcg by mouth daily.   estradiol 0.05 MG/24HR patch Commonly known as: VIVELLE-DOT Place 1 patch onto the skin 2 (two) times a week.   ibuprofen 200 MG tablet Commonly known as: ADVIL Take 200 mg by mouth as needed.   Lexapro 20 MG tablet Generic drug: escitalopram Take 20 mg by mouth at bedtime.   losartan 50 MG tablet Commonly known as: COZAAR Take 25 mg by mouth at bedtime.   melatonin 3 MG Tabs tablet Take 3 mg by mouth at bedtime.   Ofatumumab 20 MG/0.4ML Soaj Inject 20 mg into the skin every 28 (twenty-eight) days.   Vitamin D-1000 Max St 25 MCG (1000 UT) tablet Generic drug: Cholecalciferol Take 1,000 Units by mouth daily.        Allergies:  Allergies  Allergen Reactions   Gadobenate Swelling, Hives and Itching    Lower lip swelling.  11/2 - BREAKTHROUGH RXN - Hives/Itching on Rt side - treated with PO Benadryl. Manuela Schwartz RN   Lower lip swelling.   Iodinated Contrast Media     Other Reaction(s): Other (See Comments)  Lip and tongue swelling    Family History: Family History  Problem Relation Age of Onset   Hypertension Father    Colon cancer Maternal Uncle 31   Breast cancer Paternal Grandmother 59       second   Colon cancer Paternal Grandmother 5       first   Lung cancer Paternal Grandmother        third   Breast cancer Cousin 82       paternal cousin    Social History:  reports that she has never smoked. She has never used smokeless tobacco. She reports that she does not drink alcohol and does not use  drugs.   Physical Exam: BP (!) 148/83   Pulse 87   Ht 5\' 7"  (1.702 m)   Wt 198 lb (89.8 kg)   BMI 31.01 kg/m   Constitutional:  Alert and oriented, No acute distress. HEENT: [place] AT Respiratory: Normal respiratory effort, no increased work of breathing. Neurologic: Grossly intact, no focal deficits, moving all 4 extremities. Psychiatric: Normal mood and affect.  Assessment & Plan:    1. Status post staged ureteroscopic removal of bilateral renal pelvic calculi Recommend metabolic evaluation to include 24 hr urine study and blood work through [facility] today-will call with results.  I have reviewed the above documentation for accuracy and completeness, and I agree with the above.   Abbie Sons, [place] [address] [place], [place] [address] [phone]

## 2023-01-28 ENCOUNTER — Encounter: Payer: Self-pay | Admitting: Urology

## 2023-01-30 ENCOUNTER — Other Ambulatory Visit: Payer: Self-pay | Admitting: Urology

## 2023-01-30 DIAGNOSIS — N23 Unspecified renal colic: Secondary | ICD-10-CM

## 2023-01-30 DIAGNOSIS — Z87442 Personal history of urinary calculi: Secondary | ICD-10-CM

## 2023-02-04 ENCOUNTER — Ambulatory Visit
Admission: RE | Admit: 2023-02-04 | Discharge: 2023-02-04 | Disposition: A | Discharge status: Home / Self Care | Payer: Medicare Other | Source: Ambulatory Visit | Attending: Urology | Admitting: Urology

## 2023-02-04 DIAGNOSIS — K8020 Calculus of gallbladder without cholecystitis without obstruction: Secondary | ICD-10-CM | POA: Diagnosis not present

## 2023-02-04 DIAGNOSIS — Z87442 Personal history of urinary calculi: Secondary | ICD-10-CM | POA: Diagnosis present

## 2023-02-04 DIAGNOSIS — N23 Unspecified renal colic: Secondary | ICD-10-CM | POA: Diagnosis present

## 2023-02-04 DIAGNOSIS — N2889 Other specified disorders of kidney and ureter: Secondary | ICD-10-CM | POA: Insufficient documentation

## 2023-02-05 ENCOUNTER — Other Ambulatory Visit: Payer: Medicare Other

## 2023-02-05 ENCOUNTER — Other Ambulatory Visit: Payer: Self-pay | Admitting: *Deleted

## 2023-02-05 ENCOUNTER — Encounter: Payer: Self-pay | Admitting: Urology

## 2023-02-05 DIAGNOSIS — N200 Calculus of kidney: Secondary | ICD-10-CM

## 2023-02-05 LAB — MICROSCOPIC EXAMINATION: Epithelial Cells (non renal): >10 /hpf — AB (ref 0–10)

## 2023-02-05 LAB — URINALYSIS, COMPLETE
Bilirubin, UA: NEGATIVE
Glucose, UA: NEGATIVE
Ketones, UA: NEGATIVE
Leukocytes,UA: NEGATIVE
Nitrite, UA: NEGATIVE
Protein,UA: NEGATIVE
RBC, UA: NEGATIVE
Specific Gravity, UA: 1.02 (ref 1.005–1.030)
Urobilinogen, Ur: 0.2 mg/dL (ref 0.2–1.0)
pH, UA: 7.5 (ref 5.0–7.5)

## 2023-02-07 ENCOUNTER — Encounter: Payer: Self-pay | Admitting: *Deleted
